# Patient Record
Sex: Female | Born: 1935 | Race: White | Hispanic: No | Smoking: Never smoker
Health system: Southern US, Community
[De-identification: ages and names within clinical notes are randomized; demographics above are authoritative.]

## PROBLEM LIST (undated history)

## (undated) DIAGNOSIS — F0390 Unspecified dementia without behavioral disturbance: Secondary | ICD-10-CM

## (undated) DIAGNOSIS — I10 Essential (primary) hypertension: Secondary | ICD-10-CM

---

## 2015-04-25 ENCOUNTER — Inpatient Hospital Stay (HOSPITAL_COMMUNITY)
Admission: EM | Admit: 2015-04-25 | Discharge: 2015-04-28 | DRG: 871 | Disposition: A | Discharge status: Skilled Nursing Facility | Payer: Medicare Other | Attending: Internal Medicine | Admitting: Internal Medicine

## 2015-04-25 ENCOUNTER — Emergency Department (HOSPITAL_COMMUNITY): Payer: Medicare Other

## 2015-04-25 ENCOUNTER — Encounter (HOSPITAL_COMMUNITY): Payer: Self-pay | Admitting: Emergency Medicine

## 2015-04-25 DIAGNOSIS — A419 Sepsis, unspecified organism: Secondary | ICD-10-CM | POA: Diagnosis present

## 2015-04-25 DIAGNOSIS — I10 Essential (primary) hypertension: Secondary | ICD-10-CM | POA: Diagnosis present

## 2015-04-25 DIAGNOSIS — R531 Weakness: Secondary | ICD-10-CM | POA: Diagnosis present

## 2015-04-25 DIAGNOSIS — T17908A Unspecified foreign body in respiratory tract, part unspecified causing other injury, initial encounter: Secondary | ICD-10-CM

## 2015-04-25 DIAGNOSIS — Z681 Body mass index (BMI) 19 or less, adult: Secondary | ICD-10-CM

## 2015-04-25 DIAGNOSIS — F0390 Unspecified dementia without behavioral disturbance: Secondary | ICD-10-CM | POA: Diagnosis not present

## 2015-04-25 DIAGNOSIS — N390 Urinary tract infection, site not specified: Secondary | ICD-10-CM | POA: Diagnosis present

## 2015-04-25 DIAGNOSIS — H9190 Unspecified hearing loss, unspecified ear: Secondary | ICD-10-CM | POA: Diagnosis present

## 2015-04-25 DIAGNOSIS — Z66 Do not resuscitate: Secondary | ICD-10-CM | POA: Diagnosis present

## 2015-04-25 DIAGNOSIS — J189 Pneumonia, unspecified organism: Secondary | ICD-10-CM | POA: Diagnosis present

## 2015-04-25 DIAGNOSIS — B9620 Unspecified Escherichia coli [E. coli] as the cause of diseases classified elsewhere: Secondary | ICD-10-CM | POA: Diagnosis present

## 2015-04-25 DIAGNOSIS — E43 Unspecified severe protein-calorie malnutrition: Secondary | ICD-10-CM | POA: Diagnosis present

## 2015-04-25 DIAGNOSIS — E440 Moderate protein-calorie malnutrition: Secondary | ICD-10-CM | POA: Insufficient documentation

## 2015-04-25 DIAGNOSIS — L8990 Pressure ulcer of unspecified site, unspecified stage: Secondary | ICD-10-CM | POA: Insufficient documentation

## 2015-04-25 HISTORY — DX: Essential (primary) hypertension: I10

## 2015-04-25 HISTORY — DX: Unspecified dementia, unspecified severity, without behavioral disturbance, psychotic disturbance, mood disturbance, and anxiety: F03.90

## 2015-04-25 LAB — I-STAT TROPONIN, ED: TROPONIN I, POC: 0.01 ng/mL (ref 0.00–0.08)

## 2015-04-25 LAB — COMPREHENSIVE METABOLIC PANEL
ALT: 9 U/L — ABNORMAL LOW (ref 14–54)
AST: 19 U/L (ref 15–41)
Albumin: 3.2 g/dL — ABNORMAL LOW (ref 3.5–5.0)
Alkaline Phosphatase: 86 U/L (ref 38–126)
Anion gap: 8 (ref 5–15)
BUN: 18 mg/dL (ref 6–20)
CHLORIDE: 104 mmol/L (ref 101–111)
CO2: 28 mmol/L (ref 22–32)
Calcium: 9.3 mg/dL (ref 8.9–10.3)
Creatinine, Ser: 0.73 mg/dL (ref 0.44–1.00)
Glucose, Bld: 121 mg/dL — ABNORMAL HIGH (ref 65–99)
POTASSIUM: 3.7 mmol/L (ref 3.5–5.1)
SODIUM: 140 mmol/L (ref 135–145)
Total Bilirubin: 0.8 mg/dL (ref 0.3–1.2)
Total Protein: 7.8 g/dL (ref 6.5–8.1)

## 2015-04-25 LAB — I-STAT CG4 LACTIC ACID, ED: Lactic Acid, Venous: 1.22 mmol/L (ref 0.5–2.0)

## 2015-04-25 LAB — URINALYSIS, ROUTINE W REFLEX MICROSCOPIC
BILIRUBIN URINE: NEGATIVE
Glucose, UA: NEGATIVE mg/dL
KETONES UR: NEGATIVE mg/dL
NITRITE: POSITIVE — AB
PROTEIN: NEGATIVE mg/dL
Specific Gravity, Urine: 1.022 (ref 1.005–1.030)
UROBILINOGEN UA: 0.2 mg/dL (ref 0.0–1.0)
pH: 6 (ref 5.0–8.0)

## 2015-04-25 LAB — CBC
HCT: 38.8 % (ref 36.0–46.0)
Hemoglobin: 12.6 g/dL (ref 12.0–15.0)
MCH: 28.8 pg (ref 26.0–34.0)
MCHC: 32.5 g/dL (ref 30.0–36.0)
MCV: 88.8 fL (ref 78.0–100.0)
PLATELETS: 278 10*3/uL (ref 150–400)
RBC: 4.37 MIL/uL (ref 3.87–5.11)
RDW: 13.1 % (ref 11.5–15.5)
WBC: 11.7 10*3/uL — AB (ref 4.0–10.5)

## 2015-04-25 LAB — PROTIME-INR
INR: 1.25 (ref 0.00–1.49)
PROTHROMBIN TIME: 15.9 s — AB (ref 11.6–15.2)

## 2015-04-25 LAB — URINE MICROSCOPIC-ADD ON

## 2015-04-25 LAB — LACTIC ACID, PLASMA: LACTIC ACID, VENOUS: 1.2 mmol/L (ref 0.5–2.0)

## 2015-04-25 LAB — APTT: aPTT: 35 seconds (ref 24–37)

## 2015-04-25 LAB — PROCALCITONIN

## 2015-04-25 MED ORDER — OXYCODONE HCL 5 MG PO TABS: 5.0000 mg | ORAL_TABLET | ORAL | Status: DC | PRN

## 2015-04-25 MED ORDER — SODIUM CHLORIDE 0.9 % IV BOLUS (SEPSIS)
1000.0000 mL | Freq: Once | INTRAVENOUS | Status: AC
  Administered 2015-04-25: 1000 mL via INTRAVENOUS

## 2015-04-25 MED ORDER — DEXTROSE 5 % IV SOLN: 500.0000 mg | Freq: Once | INTRAVENOUS | Status: DC

## 2015-04-25 MED ORDER — ONDANSETRON HCL 4 MG PO TABS: 4.0000 mg | ORAL_TABLET | Freq: Four times a day (QID) | ORAL | Status: DC | PRN

## 2015-04-25 MED ORDER — PANTOPRAZOLE SODIUM 40 MG IV SOLR
40.0000 mg | Freq: Every day | INTRAVENOUS | Status: DC
  Administered 2015-04-25 – 2015-04-27 (×3): 40 mg via INTRAVENOUS
  Filled 2015-04-25 (×3): qty 40

## 2015-04-25 MED ORDER — CHLORHEXIDINE GLUCONATE 0.12 % MT SOLN
15.0000 mL | Freq: Two times a day (BID) | OROMUCOSAL | Status: DC
  Administered 2015-04-26 – 2015-04-28 (×6): 15 mL via OROMUCOSAL
  Filled 2015-04-25 (×6): qty 15

## 2015-04-25 MED ORDER — SODIUM CHLORIDE 0.9 % IV BOLUS (SEPSIS): 1000.0000 mL | Freq: Once | INTRAVENOUS | Status: DC

## 2015-04-25 MED ORDER — SODIUM CHLORIDE 0.9 % IV SOLN: INTRAVENOUS | Status: DC

## 2015-04-25 MED ORDER — SODIUM CHLORIDE 0.9 % IV BOLUS (SEPSIS): 1000.0000 mL | INTRAVENOUS | Status: AC

## 2015-04-25 MED ORDER — HYDROMORPHONE HCL 1 MG/ML IJ SOLN: 0.5000 mg | INTRAMUSCULAR | Status: DC | PRN

## 2015-04-25 MED ORDER — ALUM & MAG HYDROXIDE-SIMETH 200-200-20 MG/5ML PO SUSP: 30.0000 mL | Freq: Four times a day (QID) | ORAL | Status: DC | PRN

## 2015-04-25 MED ORDER — ONDANSETRON HCL 4 MG/2ML IJ SOLN: 4.0000 mg | Freq: Four times a day (QID) | INTRAMUSCULAR | Status: DC | PRN

## 2015-04-25 MED ORDER — DEXTROSE 5 % IV SOLN
1.0000 g | INTRAVENOUS | Status: DC
  Administered 2015-04-26 – 2015-04-27 (×2): 1 g via INTRAVENOUS
  Filled 2015-04-25 (×3): qty 10

## 2015-04-25 MED ORDER — SODIUM CHLORIDE 0.9 % IV SOLN
INTRAVENOUS | Status: DC
  Administered 2015-04-25 – 2015-04-26 (×2): via INTRAVENOUS

## 2015-04-25 MED ORDER — AZITHROMYCIN 500 MG IV SOLR: 500.0000 mg | INTRAVENOUS | Status: DC

## 2015-04-25 MED ORDER — ACETAMINOPHEN 325 MG PO TABS
650.0000 mg | ORAL_TABLET | Freq: Four times a day (QID) | ORAL | Status: DC | PRN
Start: 2015-04-25 — End: 2015-04-28

## 2015-04-25 MED ORDER — ACETAMINOPHEN 650 MG RE SUPP
650.0000 mg | Freq: Four times a day (QID) | RECTAL | Status: DC | PRN
Start: 2015-04-25 — End: 2015-04-28

## 2015-04-25 MED ORDER — ENOXAPARIN SODIUM 30 MG/0.3ML ~~LOC~~ SOLN
30.0000 mg | SUBCUTANEOUS | Status: DC
  Administered 2015-04-26: 30 mg via SUBCUTANEOUS
  Filled 2015-04-25 (×2): qty 0.3

## 2015-04-25 MED ORDER — CETYLPYRIDINIUM CHLORIDE 0.05 % MT LIQD
7.0000 mL | Freq: Two times a day (BID) | OROMUCOSAL | Status: DC
  Administered 2015-04-26 – 2015-04-27 (×4): 7 mL via OROMUCOSAL

## 2015-04-25 MED ORDER — AZITHROMYCIN 500 MG IV SOLR
500.0000 mg | INTRAVENOUS | Status: DC
  Administered 2015-04-26 – 2015-04-27 (×2): 500 mg via INTRAVENOUS
  Filled 2015-04-25 (×4): qty 500

## 2015-04-25 MED ORDER — DEXTROSE 5 % IV SOLN
1.0000 g | Freq: Once | INTRAVENOUS | Status: AC
  Administered 2015-04-25: 1 g via INTRAVENOUS
  Filled 2015-04-25: qty 10

## 2015-04-25 MED ORDER — DEXTROSE 5 % IV SOLN: 1.0000 g | INTRAVENOUS | Status: DC

## 2015-04-25 MED ORDER — THIAMINE HCL 100 MG/ML IJ SOLN
100.0000 mg | Freq: Once | INTRAMUSCULAR | Status: AC
Start: 2015-04-25 — End: 2015-04-25
  Administered 2015-04-25: 100 mg via INTRAVENOUS
  Filled 2015-04-25: qty 2

## 2015-04-25 MED ORDER — SODIUM CHLORIDE 0.9 % IV BOLUS (SEPSIS): 500.0000 mL | INTRAVENOUS | Status: AC

## 2015-04-25 MED ORDER — CEFTRIAXONE SODIUM 1 G IJ SOLR: 1.0000 g | Freq: Once | INTRAMUSCULAR | Status: DC

## 2015-04-25 NOTE — ED Notes (Signed)
Bed: ON62 Expected date:  Expected time:  Means of arrival:  Comments: Ems- 81 yo UTI

## 2015-04-25 NOTE — H&P (Addendum)
[facility] Admission History and Physical       Belinda Richardson WUJ:[id number] DOB: 12/22/1920 DOA: 04/25/2015  Referring physician: EDP PCP: No PCP Per Patient  Specialists:   Chief Complaint: Weakness  HPI: Belinda Richardson is a 81 y.o. female with a histotry of HTN and Dementia who presents to the ED with poor intake of foods and liquids and increased weakness for the past 2 days.   She lives with her daughter and moved from [place] 1 year ago and has not established with a PCP yet.   She is unable to give her history , and her daughter gives the history.    She is confused at baseline, and her daughter reports that she is at her baseline at this time.        Review of Systems: Unable to Obtain from the Patient  Past Medical History  Diagnosis Date  . Hypertension   . Dementia      History reviewed. No pertinent past surgical history.    Prior to Admission medications   Not on File     No Known Allergies    Social History:  reports that she does not drink alcohol. Her tobacco and drug histories are not on file.     History reviewed. No pertinent family history.     Physical Exam:  GEN:  Pleasant  Cachectic Elderly 81 y.o. Caucasian female examined and in no acute distress; cooperative with exam Filed Vitals:   04/25/15 1420 04/25/15 1622 04/25/15 1630 04/25/15 1700  BP: 179/94 166/92  145/88  Pulse: 79 123 84 113  Temp: 98.6 F (37 C)     TempSrc: Oral     Resp: 21 23 34 22  SpO2: 97% 97% 94% 95%   Blood pressure 145/88, pulse 113, temperature 98.6 F (37 C), temperature source Oral, resp. rate 22, SpO2 95 %. PSYCH: She is alert and oriented x 1; does not appear anxious does not appear depressed; affect is normal HEENT: Normocephalic and Atraumatic, Mucous membranes pink; PERRLA; EOM intact; Fundi:  Benign;  No scleral icterus, Nares: Patent, Oropharynx: Clear,   Neck:  FROM, No Cervical Lymphadenopathy nor Thyromegaly or Carotid Bruit; No  JVD; Breasts:: Not examined CHEST WALL: No tenderness CHEST: Normal respiration, clear to auscultation bilaterally HEART: Regular rate and rhythm; no murmurs rubs or gallops BACK: No kyphosis or scoliosis; No CVA tenderness ABDOMEN: Positive Bowel Sounds, Scaphoid, Soft Non-Tender, No Rebound or Guarding; No Masses, No Organomegaly. Rectal Exam: Not done EXTREMITIES: No Cyanosis, Clubbing, or Edema; No Ulcerations. Genitalia: not examined PULSES: 2+ and symmetric SKIN: Normal hydration no rash or ulceration CNS:  Alert and Oriented x 4, No Focal Deficits Vascular: pulses palpable throughout    Labs on Admission:  Basic Metabolic Panel:  Recent Labs Lab 04/25/15 1656  NA 140  K 3.7  CL 104  CO2 28  GLUCOSE 121*  BUN 18  CREATININE 0.73  CALCIUM 9.3   Liver Function Tests:  Recent Labs Lab 04/25/15 1656  AST 19  ALT 9*  ALKPHOS 86  BILITOT 0.8  PROT 7.8  ALBUMIN 3.2*   No results for input(s): LIPASE, AMYLASE in the last 168 hours. No results for input(s): AMMONIA in the last 168 hours. CBC:  Recent Labs Lab 04/25/15 1656  WBC 11.7*  HGB 12.6  HCT 38.8  MCV 88.8  PLT 278   Cardiac Enzymes: No results for input(s): CKTOTAL, CKMB, CKMBINDEX, TROPONINI in the last 168 hours.  BNP (last 3 results)  No results for input(s): BNP in the last 8760 hours.  ProBNP (last 3 results) No results for input(s): PROBNP in the last 8760 hours.  CBG: No results for input(s): GLUCAP in the last 168 hours.  Radiological Exams on Admission: Dg Chest 1 View  04/25/2015   CLINICAL DATA:  Dimension, decreased appetite.  EXAM: CHEST  1 VIEW  COMPARISON:  None.  FINDINGS: Normal cardiac silhouette. There is hazy airspace opacity in the LEFT upper lobe superior to the aortic large. There is a mild interstitial pattern in the lower lobes. No pneumothorax.  IMPRESSION: 1. Concern for LEFT upper lobe pneumonia or aspiration pneumonitis. 2. Underlying and mild interstitial edema /  venous congestion.   Electronically Signed   By: Genevive Bi M.D.   On: 04/25/2015 17:42   Ct Head Wo Contrast  04/25/2015   CLINICAL DATA:  Dementia. Marisue Ivan at home with daughter. Mental status change from baseline according to the daughter.  EXAM: CT HEAD WITHOUT CONTRAST  TECHNIQUE: Contiguous axial images were obtained from the base of the skull through the vertex without intravenous contrast.  COMPARISON:  None.  FINDINGS: There is no evidence of mass effect, midline shift, or extra-axial fluid collections. There is no evidence of a space-occupying lesion or intracranial hemorrhage. There is no evidence of a cortical-based area of acute infarction. There is an age-indeterminate lacunar infarct in thalamus bilaterally. There is an age-indeterminate right basal ganglia lacunar infarct. There is generalized cerebral atrophy. There is periventricular white matter low attenuation likely secondary to microangiopathy.  The ventricles and sulci are appropriate for the patient's age. The basal cisterns are patent.  Visualized portions of the orbits are unremarkable. There is cerumen in the right external auditory canal. The visualized portions of the paranasal sinuses and mastoid air cells are unremarkable. Cerebrovascular atherosclerotic calcifications are noted.  The osseous structures are unremarkable.  IMPRESSION: 1. Age-indeterminate lacunar infarct in thalamus bilaterally. 2. Age-indeterminate right basal ganglia lacunar infarct. 3. Chronic microvascular disease and cerebral atrophy.   Electronically Signed   By: Elige Ko   On: 04/25/2015 17:36     EKG: Independently reviewed. Norma Sinus Rhythm rate =75   Assessment/Plan:   81 y.o. female with  Active Problems:   1.    Sepsis/UTI (lower urinary tract infection)/CAP (community acquired pneumonia)   Sepsis workup Initiated   IV Rocephin and Azithromycin   Swallow Evaluation, NPO for now     2.    Weakness- due to #1 and #2      3.     Malnutrition   Swallow Eval for ? Aspiration   Nutrition Evaluation       3.    Hypertension   Monitor BPs   PRN IV Hydralazine     4.    Dementia   Without Behavioral Disturbance     5.    DVT Prophylaxis   Lovenox         Code Status:    DO NOT RESUSCITATE (DNR)      Family Communication:    No Family Present    Disposition Plan:    Inpatient Status        Time spent:  33 Minutes      Ron Parker [facility] Pager [phone]   If 7AM -7PM Please Contact the Day Rounding Team MD for [facility]  If 7PM-7AM, Please Contact Night-Floor Coverage  [email or web address] Password [facility] 04/25/2015, 6:46 PM     ADDENDUM:   Patient was seen  and examined on 04/25/2015

## 2015-04-25 NOTE — ED Notes (Signed)
Pt comes in today with EMS from home. Pt has a hx of Dementia. Pt lives at home with daughter. Daughter reports in the past 2 days she has been less active and a decreased appetite. Pt mental status is baseline per daughter. Daughter states that she has had urinary frequency with dark in color and odorous.

## 2015-04-25 NOTE — ED Notes (Signed)
Spoke with Erica, Charge RN. 

## 2015-04-25 NOTE — ED Provider Notes (Signed)
CSN: [id number]     Arrival date & time 04/25/15  1407 History   First MD Initiated Contact with Patient 04/25/15 1516     Chief Complaint  Patient presents with  . Urinary Tract Infection     (Consider location/radiation/quality/duration/timing/severity/associated sxs/prior Treatment) HPI Level V caveat dementia history is obtained from patient's daughter Dorreen Hendley via telephone at [phone]. Patient has been progressively more weak over the past 2 days with diminished appetite. She's been too weak to stand up from a chair. She normally ambulates with minimal assistance. Patient denies any specific complaint. No treatment prior to coming here. No known fever. No cough. No other associated symptoms. Past Medical History  Diagnosis Date  . Hypertension   . Dementia    History reviewed. No pertinent past surgical history. History reviewed. No pertinent family history. Social History  Substance Use Topics  . Smoking status: Unknown If Ever Smoked  . Smokeless tobacco: None  . Alcohol Use: No   OB History    No data available     Review of Systems  Unable to perform ROS: Dementia  Constitutional: Positive for activity change and appetite change.  Neurological: Positive for weakness.      Allergies  Review of patient's allergies indicates no known allergies.  Home Medications   Prior to Admission medications   Not on File   BP 166/92 mmHg  Pulse 84  Temp(Src) 98.6 F (37 C) (Oral)  Resp 34  SpO2 84% Physical Exam  Constitutional: No distress.  Chronically ill-appearing  HENT:  Head: Normocephalic and atraumatic.  Mucous membranes dry  Eyes: Conjunctivae are normal. Pupils are equal, round, and reactive to light.  Neck: Neck supple. No tracheal deviation present. No thyromegaly present.  Cardiovascular: Normal rate.   No murmur heard. Pulmonary/Chest: Effort normal and breath sounds normal.  Abdominal: Soft. Bowel sounds are normal. She exhibits no  distension. There is no tenderness.  Genitourinary:  Rectum normal tone and soft brown stool no gross blood  Musculoskeletal: Normal range of motion. She exhibits no edema or tenderness.  Neurological: She is alert. Coordination normal.  Follow simple commands moves all extremities  Skin: Skin is warm and dry. No rash noted.  Psychiatric: She has a normal mood and affect.  Nursing note and vitals reviewed.   ED Course  Procedures (including critical care time) Labs Review Labs Reviewed  CBC  COMPREHENSIVE METABOLIC PANEL  URINALYSIS, ROUTINE W REFLEX MICROSCOPIC (NOT AT [facility])  Rosezena Sensor, ED  POC OCCULT BLOOD, ED    Imaging Review No results found. I have personally reviewed and evaluated these images and lab results as part of my medical decision-making.   EKG Interpretation None     chest x-ray viewed by me Results for orders placed or performed during the hospital encounter of 04/25/15  CBC  Result Value Ref Range   WBC 11.7 (H) 4.0 - 10.5 K/uL   RBC 4.37 3.87 - 5.11 MIL/uL   Hemoglobin 12.6 12.0 - 15.0 g/dL   HCT 40.9 81.1 - 91.4 %   MCV 88.8 78.0 - 100.0 fL   MCH 28.8 26.0 - 34.0 pg   MCHC 32.5 30.0 - 36.0 g/dL   RDW 78.2 95.6 - 21.3 %   Platelets 278 150 - 400 K/uL  Comprehensive metabolic panel  Result Value Ref Range   Sodium 140 135 - 145 mmol/L   Potassium 3.7 3.5 - 5.1 mmol/L   Chloride 104 101 - 111 mmol/L  CO2 28 22 - 32 mmol/L   Glucose, Bld 121 (H) 65 - 99 mg/dL   BUN 18 6 - 20 mg/dL   Creatinine, Ser 7.82 0.44 - 1.00 mg/dL   Calcium 9.3 8.9 - 95.6 mg/dL   Total Protein 7.8 6.5 - 8.1 g/dL   Albumin 3.2 (L) 3.5 - 5.0 g/dL   AST 19 15 - 41 U/L   ALT 9 (L) 14 - 54 U/L   Alkaline Phosphatase 86 38 - 126 U/L   Total Bilirubin 0.8 0.3 - 1.2 mg/dL   GFR calc non Af Amer >60 >60 mL/min   GFR calc Af Amer >60 >60 mL/min   Anion gap 8 5 - 15  Urinalysis, Routine w reflex microscopic (not at [facility])  Result Value Ref Range   Color, Urine YELLOW  YELLOW   APPearance CLOUDY (A) CLEAR   Specific Gravity, Urine 1.022 1.005 - 1.030   pH 6.0 5.0 - 8.0   Glucose, UA NEGATIVE NEGATIVE mg/dL   Hgb urine dipstick SMALL (A) NEGATIVE   Bilirubin Urine NEGATIVE NEGATIVE   Ketones, ur NEGATIVE NEGATIVE mg/dL   Protein, ur NEGATIVE NEGATIVE mg/dL   Urobilinogen, UA 0.2 0.0 - 1.0 mg/dL   Nitrite POSITIVE (A) NEGATIVE   Leukocytes, UA SMALL (A) NEGATIVE  Urine microscopic-add on  Result Value Ref Range   WBC, UA 7-10 <3 WBC/hpf   Bacteria, UA MANY (A) RARE  I-Stat Troponin, ED (not at [facility])  Result Value Ref Range   Troponin i, poc 0.01 0.00 - 0.08 ng/mL   Comment 3           Dg Chest 1 View  04/25/2015   CLINICAL DATA:  Dimension, decreased appetite.  EXAM: CHEST  1 VIEW  COMPARISON:  None.  FINDINGS: Normal cardiac silhouette. There is hazy airspace opacity in the LEFT upper lobe superior to the aortic large. There is a mild interstitial pattern in the lower lobes. No pneumothorax.  IMPRESSION: 1. Concern for LEFT upper lobe pneumonia or aspiration pneumonitis. 2. Underlying and mild interstitial edema / venous congestion.   Electronically Signed   By: Genevive Bi M.D.   On: 04/25/2015 17:42   Ct Head Wo Contrast  04/25/2015   CLINICAL DATA:  Dementia. Marisue Ivan at home with daughter. Mental status change from baseline according to the daughter.  EXAM: CT HEAD WITHOUT CONTRAST  TECHNIQUE: Contiguous axial images were obtained from the base of the skull through the vertex without intravenous contrast.  COMPARISON:  None.  FINDINGS: There is no evidence of mass effect, midline shift, or extra-axial fluid collections. There is no evidence of a space-occupying lesion or intracranial hemorrhage. There is no evidence of a cortical-based area of acute infarction. There is an age-indeterminate lacunar infarct in thalamus bilaterally. There is an age-indeterminate right basal ganglia lacunar infarct. There is generalized cerebral atrophy. There is  periventricular white matter low attenuation likely secondary to microangiopathy.  The ventricles and sulci are appropriate for the patient's age. The basal cisterns are patent.  Visualized portions of the orbits are unremarkable. There is cerumen in the right external auditory canal. The visualized portions of the paranasal sinuses and mastoid air cells are unremarkable. Cerebrovascular atherosclerotic calcifications are noted.  The osseous structures are unremarkable.  IMPRESSION: 1. Age-indeterminate lacunar infarct in thalamus bilaterally. 2. Age-indeterminate right basal ganglia lacunar infarct. 3. Chronic microvascular disease and cerebral atrophy.   Electronically Signed   By: Elige Ko   On: 04/25/2015 17:36  MDM Code sepsis called based on Sirs criteria of tachycardi, tachypnea. Likely source respiratory  daughter reports the patient is DO NOT RESUSCITATE CODE STATUS  Final diagnoses:  UTI (lower urinary tract infection)  Spoke with Dr. Lovell Sheehan plan admit medical surgical floor  Plan intravenous fluids, intravenous antibiotics. Dx#1 community acquired pneumonia #2 altered mental status #3 urinary tract infection #4 sepsis    Doug Sou, MD 04/25/15 1845

## 2015-04-25 NOTE — Progress Notes (Signed)
EDCM went to bedside to speak to patient, however, patient very confused, with history of dementia.  EDCM called patient's daughter [name] at [phone].  Patient lives with daughter.  [name] reports patient has [employer] and [employer] cross and blue shield insurance.  EDCM informed registration of patient's insurance.  [name] also reports patient does not have a pcp as they have just moved here from [place].  [name] reports the patient has "Been like this" for five years.  Patient's dementia has gotten worse per [name].  Patient doesn't speak ,"Once ina while she will say a word or two,"  And hasn't been eating per [name].  [name] reports she has placed a call to [facility] and spoke to NP Marletta Lor and has set up an appointment to come evaluate the patient but the patient has come to the ED.  [name] reports she has called Marletta Lor to make her aware the patient was in the hospital.  [name] reports patient has 2 walkers, bedside commode and a shower chair at home.  [name] is interested in obtaining a hospital bed and a wheelchair for patient.  [name] went on to say that she would very much like the patient to go to a nursing facility at discharge as she is having a very difficult time with the patient at home. [name] reports, "she can't even stand."  EDCM explained home health services to patient's daughter.  [name] reports, "If she is unable to get up to go to the bathroom, I can't take care of her."  [name] reports the patient requires total assist with all ADL's and meals.  [name] reports, "she doesn't even know who I am anymore."  EDCM offered support to patient's daughter.  Patient's daughter thankful for services.  No further EDCM needs at this time.

## 2015-04-25 NOTE — ED Notes (Signed)
Left antecubital vein blown from where previous nurse tried to start IV. Coban pressure dressing applied and ice pack applied.

## 2015-04-25 NOTE — Progress Notes (Signed)
ANTIBIOTIC CONSULT NOTE - INITIAL  Pharmacy Consult for Ceftriaxone and Azithromycin Indication: CAP, UTI  No Known Allergies  Patient Measurements:    Vital Signs: Temp: 98.6 F (37 C) (08/24 1420) Temp Source: Oral (08/24 1420) BP: 145/88 mmHg (08/24 1700) Pulse Rate: 113 (08/24 1700) Intake/Output from previous day:   Intake/Output from this shift: Total I/O In: -  Out: 30 [Urine:30]  Labs:  Recent Labs  04/25/15 1656  WBC 11.7*  HGB 12.6  PLT 278  CREATININE 0.73   CrCl cannot be calculated (Unknown ideal weight.). No results for input(s): VANCOTROUGH, VANCOPEAK, VANCORANDOM, GENTTROUGH, GENTPEAK, GENTRANDOM, TOBRATROUGH, TOBRAPEAK, TOBRARND, AMIKACINPEAK, AMIKACINTROU, AMIKACIN in the last 72 hours.   Microbiology: No results found for this or any previous visit (from the past 720 hour(s)).  Medical History: Past Medical History  Diagnosis Date  . Hypertension   . Dementia     Medications:  Scheduled:   Infusions:  . azithromycin    . cefTRIAXone (ROCEPHIN)  IV    . sodium chloride    . sodium chloride     Followed by  . sodium chloride     PRN:   Assessment: 81 yo female with dementia presents from home with progressive weakness, decreased appetite, urinary frequency and dark, malodorous urine. CXR concerning for LUL pneumonia or aspiration pneumonitis. Code sepsis called and Pharmacy consulted to dose antibiotics.  8/24 >> ceftriaxone >> 8/24 >> azithromycin >>  Tmax: afebrile WBC: 11.7k Renal: SCr 0.73, CrCl ~50 ml/min/1.41m2 (normalized)  8/24 blood cx: sent 8/24 urine cx: sent (UA pyuria)  Goal of Therapy:  Eradication of infection  Plan:   Ceftriaxone 1g IV q24h x 7 days  Azithromycin  IV q24h x 7 days  No further dose adjustments needed, pharmacy will sign off  Transition to oral therapy as appropriate  Belinda Richardson, PharmD, BCPS Pager: [phone] 04/25/2015,6:24 PM

## 2015-04-26 ENCOUNTER — Inpatient Hospital Stay (HOSPITAL_COMMUNITY): Payer: Medicare Other

## 2015-04-26 ENCOUNTER — Encounter (HOSPITAL_COMMUNITY): Payer: Self-pay | Admitting: *Deleted

## 2015-04-26 DIAGNOSIS — E440 Moderate protein-calorie malnutrition: Secondary | ICD-10-CM | POA: Insufficient documentation

## 2015-04-26 DIAGNOSIS — L8990 Pressure ulcer of unspecified site, unspecified stage: Secondary | ICD-10-CM | POA: Insufficient documentation

## 2015-04-26 DIAGNOSIS — J189 Pneumonia, unspecified organism: Secondary | ICD-10-CM

## 2015-04-26 DIAGNOSIS — A419 Sepsis, unspecified organism: Principal | ICD-10-CM

## 2015-04-26 DIAGNOSIS — F0390 Unspecified dementia without behavioral disturbance: Secondary | ICD-10-CM

## 2015-04-26 DIAGNOSIS — N390 Urinary tract infection, site not specified: Secondary | ICD-10-CM

## 2015-04-26 LAB — CBC
HCT: 32.5 % — ABNORMAL LOW (ref 36.0–46.0)
Hemoglobin: 10.5 g/dL — ABNORMAL LOW (ref 12.0–15.0)
MCH: 28.9 pg (ref 26.0–34.0)
MCHC: 32.3 g/dL (ref 30.0–36.0)
MCV: 89.5 fL (ref 78.0–100.0)
PLATELETS: 219 10*3/uL (ref 150–400)
RBC: 3.63 MIL/uL — AB (ref 3.87–5.11)
RDW: 13.3 % (ref 11.5–15.5)
WBC: 8.4 10*3/uL (ref 4.0–10.5)

## 2015-04-26 LAB — BASIC METABOLIC PANEL
Anion gap: 7 (ref 5–15)
BUN: 16 mg/dL (ref 6–20)
CHLORIDE: 111 mmol/L (ref 101–111)
CO2: 23 mmol/L (ref 22–32)
CREATININE: 0.56 mg/dL (ref 0.44–1.00)
Calcium: 8.6 mg/dL — ABNORMAL LOW (ref 8.9–10.3)
GFR calc Af Amer: >60 mL/min (ref >60)
GFR calc non Af Amer: >60 mL/min (ref >60)
GLUCOSE: 91 mg/dL (ref 65–99)
Potassium: 3.9 mmol/L (ref 3.5–5.1)
Sodium: 141 mmol/L (ref 135–145)

## 2015-04-26 MED ORDER — ENOXAPARIN SODIUM 40 MG/0.4ML ~~LOC~~ SOLN
40.0000 mg | SUBCUTANEOUS | Status: DC
  Administered 2015-04-26 – 2015-04-27 (×2): 40 mg via SUBCUTANEOUS
  Filled 2015-04-26 (×2): qty 0.4

## 2015-04-26 NOTE — Care Management Note (Signed)
Case Management Note  Patient Details  Name: Asmaa Tirpak MRN: [id number] Date of Birth: 1920/11/03  Subjective/Objective: 81 y/o f admitted w/PNA, UTI. From home w/dtr.PT cons-await recommendations.                 Action/Plan:Monitor progress for d/c plans.   Expected Discharge Date:                  Expected Discharge Plan:  Skilled Nursing Facility  In-House Referral:     Discharge planning Services  CM Consult  Post Acute Care Choice:    Choice offered to:     DME Arranged:    DME Agency:     HH Arranged:    HH Agency:     Status of Service:  In process, will continue to follow  Medicare Important Message Given:    Date Medicare IM Given:    Medicare IM give by:    Date Additional Medicare IM Given:    Additional Medicare Important Message give by:     If discussed at Long Length of Stay Meetings, dates discussed:    Additional Comments:  Lanier Clam, RN 04/26/2015, 3:03 PM

## 2015-04-26 NOTE — Progress Notes (Signed)
Initial Nutrition Assessment  DOCUMENTATION CODES:   Underweight, Severe malnutrition in context of chronic illness  INTERVENTION:  - Will order supplements as appropriate with diet advancement -RD will continue to monitor for needs  NUTRITION DIAGNOSIS:   Inadequate oral intake related to inability to eat as evidenced by NPO status.  GOAL:   Patient will meet greater than or equal to 90% of their needs  MONITOR:   Diet advancement, Weight trends, Labs, I & O's  REASON FOR ASSESSMENT:   Malnutrition Screening Tool  ASSESSMENT:   81 y.o. female with a histotry of HTN and Dementia who presents to the ED with poor intake of foods and liquids and increased weakness for the past 2 days. She lives with her daughter and moved from [place] 1 year ago and has not established with a PCP yet. She is unable to give her history , and her daughter gives the history. She is confused at baseline, and her daughter reports that she is at her baseline at this time.   Pt seen for MST. BMI indicates underweight status. Pt has been NPO since admission and MBS scheduled for later today which was confirmed per discussion with RN.  Pt with hx of dementia and sleeping at time of visit. No family/visitors present. Performed physical assessment which showed moderate and severe muscle and moderate and severe fat wasting; called pt's name throughout physical assessment so she would not be startled in case she awoke.  No prior weight hx available in the chart. Not able to meet needs. Medications reviewed. Labs reviewed; Ca: 8.6 mg/dL.      Diet Order:    NPO  Skin:  Wound (see comment) (Stage 1 sacral pressure ulcer)  Last BM:  PTA  Height:   Ht Readings from Last 1 Encounters:  04/25/15  (1.778 m)    Weight:   Wt Readings from Last 1 Encounters:  04/25/15 115 lb 1.3 oz (52.2 kg)    Ideal Body Weight:  68.18 kg (kg)  BMI:  Body mass index is 16.51 kg/(m^2).  Estimated  Nutritional Needs:   Kcal:  1050-1250  Protein:  50-60 grarms  Fluid:  2.2 L/day  EDUCATION NEEDS:   No education needs identified at this time     Trenton Gammon, RD, LDN Inpatient Clinical Dietitian Pager # [phone] After hours/weekend pager # [phone]

## 2015-04-26 NOTE — Progress Notes (Signed)
SLP order received for MBS, spoke to RN who reports pt has been asleep since admit. SLP to follow up later today to determine if pt adequately alert for MBS/po intake.  Belinda Burnet, MS [facility] SLP [phone]

## 2015-04-26 NOTE — Progress Notes (Signed)
Full report in image section  MBS Assessment / Plan / Recommendation [facility] IP CLINICAL IMPRESSIONS 04/26/2015  Therapy Diagnosis Mild oral phase dysphagia;Mild pharyngeal phase dysphagia  Clinical Impression Mild oropharyngeal dysphagia with decreased oral bolus cohesion due to discoordination resulting in minimal residuals after swallow.  Mastication of cracker was laborious for patient evidenced by delay and vertical mastication pattern.  Pharyngeal swallow was mildly delayed to valleuclar space - spilling over epiglottis due to premature spillage and sensory deficits.  TRACE SILENT aspiration of thin x1 of 5 boluses noted that did not fully clear with cued cough.  Use of straw prevented aspiration  - likely due to better oral control and decreased premature spillage.  Skilled intervention included determining effective compensation strategies.  Cued throat clear and dry swallows faciliate clearance.     Educated pt to findings using live video, however her hearing loss and cognitive impairment impact her receptive communication and recall.  Of note, suspect component of esophageal deficits with appearance of delayed clearance- radiologist not present to confirm.  Thin water appeared to aid clearance - suspect pt may be at more risk of aspiration with nectar thick consistencies due to suspected esophageal isues/ ? decreased clearance.    Recommend dys3/thin with strict precautions - using straws for liquids. SLP to follow up to educate family/pt to mitigation strategies for dysphagia management.  Thanks for this consult. Ruel Favors IP TREATMENT RECOMMENDATION 04/26/2015  Treatment Recommendations Therapy as outlined in treatment plan below     CHL IP DIET RECOMMENDATION 04/26/2015  SLP Diet Recommendations Dysphagia 3 (Mech soft);Thin  Liquid Administration via Straws  Medication Administration Whole meds with puree  Compensations Slow rate;Small sips/bites;Follow solids with liquid;Clear throat  intermittently  Postural Changes and/or Swallow Maneuvers Reflux precautions      Donavan Burnet, MS [facility] SLP [phone]

## 2015-04-26 NOTE — Progress Notes (Signed)
PROGRESS NOTE  Belinda Richardson ZOX:[id number] DOB: 12-13-1920 DOA: 04/25/2015 PCP: No PCP Per Patient  HPI: 81 y.o. female with a histotry of HTN and Dementia who presents to the ED with poor intake of foods and liquids and increased weakness 2 days prior to admission. She was found to have a UTI and CAP and was admitted for IV antibiotics.   Subjective / 24 H Interval events - alert this morning, oriented to person only - has no complaints  Assessment/Plan: Active Problems:   Sepsis   UTI (lower urinary tract infection)   CAP (community acquired pneumonia)   Weakness   Hypertension   Dementia   Community acquired pneumonia   Pressure ulcer   Malnutrition of moderate degree   Sepsis in the setting of UTI / CAP - continue Ceftriaxone and Azithromycin. - urine cultures, blood cultures pending  - sepsis physiology improving   UTI - UA positive for an infection, cultures pending. On Ceftriaxone  CAP - CXR with left upper lobe pneumonia - SLP evaluation pending to evaluate for aspiration - antibiotics as above  HTN - history of HTN, not on any medications, currently blood pressure is normal, monitor  Moderate malnutrition - nutrition consult  Dementia - PT evaluation pending - SLP evaluation pending   Diet:   Fluids: NS DVT Prophylaxis: Lovenox  Code Status: DNR Family Communication: no family bedside  Disposition Plan: remain inpatient  Consultants:  None   Procedures:  None    Antibiotics Ceftriaxone 8/24 >> Azithromycin 8/24 >>   Studies  Dg Chest 1 View  04/25/2015   CLINICAL DATA:  Dimension, decreased appetite.  EXAM: CHEST  1 VIEW  COMPARISON:  None.  FINDINGS: Normal cardiac silhouette. There is hazy airspace opacity in the LEFT upper lobe superior to the aortic large. There is a mild interstitial pattern in the lower lobes. No pneumothorax.  IMPRESSION: 1. Concern for LEFT upper lobe pneumonia or aspiration pneumonitis. 2. Underlying and  mild interstitial edema / venous congestion.   Electronically Signed   By: Genevive Bi M.D.   On: 04/25/2015 17:42   Ct Head Wo Contrast  04/25/2015   CLINICAL DATA:  Dementia. Marisue Ivan at home with daughter. Mental status change from baseline according to the daughter.  EXAM: CT HEAD WITHOUT CONTRAST  TECHNIQUE: Contiguous axial images were obtained from the base of the skull through the vertex without intravenous contrast.  COMPARISON:  None.  FINDINGS: There is no evidence of mass effect, midline shift, or extra-axial fluid collections. There is no evidence of a space-occupying lesion or intracranial hemorrhage. There is no evidence of a cortical-based area of acute infarction. There is an age-indeterminate lacunar infarct in thalamus bilaterally. There is an age-indeterminate right basal ganglia lacunar infarct. There is generalized cerebral atrophy. There is periventricular white matter low attenuation likely secondary to microangiopathy.  The ventricles and sulci are appropriate for the patient's age. The basal cisterns are patent.  Visualized portions of the orbits are unremarkable. There is cerumen in the right external auditory canal. The visualized portions of the paranasal sinuses and mastoid air cells are unremarkable. Cerebrovascular atherosclerotic calcifications are noted.  The osseous structures are unremarkable.  IMPRESSION: 1. Age-indeterminate lacunar infarct in thalamus bilaterally. 2. Age-indeterminate right basal ganglia lacunar infarct. 3. Chronic microvascular disease and cerebral atrophy.   Electronically Signed   By: Elige Ko   On: 04/25/2015 17:36    Objective  Filed Vitals:   04/25/15 2200 04/26/15 0300 04/26/15 8119 04/26/15 1200  BP:  116/71 126/68 120/65  Pulse:  90 88   Temp:  98.6 F (37 C) 97.6 F (36.4 C) 98.2 F (36.8 C)  TempSrc:  Axillary Oral Oral  Resp:  16 16 24   Height: 5\' 10"  (1.778 m)     Weight:      SpO2:  97% 98% 99%    Intake/Output Summary  (Last 24 hours) at 04/26/15 1240 Last data filed at 04/25/15 2300  Gross per 24 hour  Intake  36.25 ml  Output     30 ml  Net   6.25 ml   Filed Weights   04/25/15 2005  Weight: 52.2 kg (115 lb 1.3 oz)    Exam:  GENERAL: NAD, pleasantly confused  HEENT: head NCAT, no scleral icterus.   NECK: Supple.   LUNGS: Clear to auscultation. No wheezing or crackles  HEART: Regular rate and rhythm without murmur. 2+ pulses, no JVD, no peripheral edema  ABDOMEN: Soft, nontender, and nondistended. Positive bowel sounds.  EXTREMITIES: Without any cyanosis, clubbing  NEUROLOGIC: Alert and oriented x1. Strength 5/5 in all 4.  PSYCHIATRIC: with dementia  SKIN: No ulceration or induration present, healed sacral pressure ulcer  Data Reviewed: Basic Metabolic Panel:  Recent Labs Lab 04/25/15 1656 04/26/15 0535  NA 140 141  K 3.7 3.9  CL 104 111  CO2 28 23  GLUCOSE 121* 91  BUN 18 16  CREATININE 0.73 0.56  CALCIUM 9.3 8.6*   Liver Function Tests:  Recent Labs Lab 04/25/15 1656  AST 19  ALT 9*  ALKPHOS 86  BILITOT 0.8  PROT 7.8  ALBUMIN 3.2*   CBC:  Recent Labs Lab 04/25/15 1656 04/26/15 0535  WBC 11.7* 8.4  HGB 12.6 10.5*  HCT 38.8 32.5*  MCV 88.8 89.5  PLT 278 219    Recent Results (from the past 240 hour(s))  Urine culture     Status: None (Preliminary result)   Collection Time: 04/25/15  6:17 PM  Result Value Ref Range Status   Specimen Description URINE, RANDOM  Final   Special Requests Normal  Final   Culture   Final    TOO YOUNG TO READ Performed at [facility]    Report Status PENDING  Incomplete     Scheduled Meds: . antiseptic oral rinse  7 mL Mouth Rinse q12n4p  . azithromycin  500 mg Intravenous Once  . azithromycin  500 mg Intravenous Q24H  . cefTRIAXone (ROCEPHIN)  IV  1 g Intravenous Q24H  . chlorhexidine  15 mL Mouth Rinse BID  . enoxaparin (LOVENOX) injection  30 mg Subcutaneous Q24H  . pantoprazole (PROTONIX) IV  40 mg  Intravenous QHS   Continuous Infusions: . sodium chloride 50 mL/hr at 04/26/15 0908     Pamella Pert, MD [facility] Pager [phone]. If 7 PM - 7 AM, please contact night-coverage at [email or web address], password [facility] 04/26/2015, 12:40 PM  LOS: 1 day

## 2015-04-27 NOTE — Progress Notes (Signed)
Speech Language Pathology Treatment: Dysphagia  Patient Details Name: Belinda Richardson MRN: [id number] DOB: [birth year] Today's Date: 04/27/2015 Time: 1210-1223 SLP Time Calculation (min) (ACUTE ONLY): 13 min  Assessment / Plan / Recommendation Clinical Impression  F/u after yesterday's MBS. Pt alert, answering basic questions, states she is in [place].  Able to feed herself with safe rate and bolus sizes.  Consumed applesauce and water from straw with adequate oral attention and control, no overt s/s of aspiration (however, MBS showed trace silent aspiration - this is prevented with use of straw).  Mod assist overall for self-feeding.  Recommend continuing dysphagia 3 diet, thin liquids with precautions as described at [facility].  No further SLP f/u warranted.  Continue full supervision - allow pt to self-feed as able.     HPI Other Pertinent Information: 81 y.o. female with a histotry of HTN and Dementia who presents to the ED with poor intake of foods and liquids and increased weakness for the past 2 days. She lives with her daughter and moved from [place] 1 year ago and has not established with a PCP yet. She is confused at baseline, and her daughter reported to dietician that she is at her baseline at this time per RD note. Concern for left upper lobe pna or aspiration pneumonitis noted on CXR.  Swallow evaluation ordered (MBS).     Pertinent Vitals Pain Assessment: No/denies pain  SLP Plan  All goals met    Recommendations Diet recommendations: Dysphagia 3 (mechanical soft);Thin liquid Liquids provided via: Straw Medication Administration: Whole meds with puree Supervision: Patient able to self feed;Staff to assist with self feeding Compensations: Slow rate;Small sips/bites;Follow solids with liquid;Clear throat intermittently Postural Changes and/or Swallow Maneuvers: Seated upright 90 degrees              Oral Care Recommendations: Oral care BID Follow up Recommendations:  Skilled Nursing facility Plan: All goals met   Belinda Richardson, [place] CCC/SLP Pager [phone]      Belinda Richardson 04/27/2015, 12:29 PM

## 2015-04-27 NOTE — Progress Notes (Signed)
PROGRESS NOTE  Belinda Richardson ZOX:[id number] DOB: [birth year] DOA: 04/25/2015 PCP: No PCP Per Patient  HPI: 81 y.o. female with a histotry of HTN and Dementia who presents to the ED with poor intake of foods and liquids and increased weakness 2 days prior to admission. She was found to have a UTI and CAP and was admitted for IV antibiotics.   Subjective / 24 H Interval events - alert this morning, oriented to person only - no chest pain, shortness of breath, no abdominal pain, nausea or vomiting.   Assessment/Plan: Active Problems:   Sepsis   UTI (lower urinary tract infection)   CAP (community acquired pneumonia)   Weakness   Hypertension   Dementia   Community acquired pneumonia   Pressure ulcer   Malnutrition of moderate degree   Sepsis in the setting of UTI / CAP - continue Ceftriaxone and Azithromycin. - urine cultures, blood cultures pending, negative so fat  UTI - UA positive for an infection, cultures pending. On Ceftriaxone  CAP - CXR with left upper lobe pneumonia - SLP evaluation recommending Dysphagia 3 - antibiotics as above  HTN - history of HTN, not on any medications, currently blood pressure is fair  Moderate malnutrition - nutrition consult  Dementia - PT evaluation pending   Diet: DIET DYS 3 Room service appropriate?: Yes; Fluid consistency:: Thin Fluids: NS DVT Prophylaxis: Lovenox  Code Status: DNR Family Communication: no family bedside  Disposition Plan: remain inpatient, PT to evaluate  Consultants:  None   Procedures:  None    Antibiotics Ceftriaxone 8/24 >> Azithromycin 8/24 >>   Studies  Dg Chest 1 View  04/25/2015   CLINICAL DATA:  Dimension, decreased appetite.  EXAM: CHEST  1 VIEW  COMPARISON:  None.  FINDINGS: Normal cardiac silhouette. There is hazy airspace opacity in the LEFT upper lobe superior to the aortic large. There is a mild interstitial pattern in the lower lobes. No pneumothorax.  IMPRESSION: 1. Concern  for LEFT upper lobe pneumonia or aspiration pneumonitis. 2. Underlying and mild interstitial edema / venous congestion.   Electronically Signed   By: Genevive Bi M.D.   On: 04/25/2015 17:42   Ct Head Wo Contrast  04/25/2015   CLINICAL DATA:  Dementia. Marisue Ivan at home with daughter. Mental status change from baseline according to the daughter.  EXAM: CT HEAD WITHOUT CONTRAST  TECHNIQUE: Contiguous axial images were obtained from the base of the skull through the vertex without intravenous contrast.  COMPARISON:  None.  FINDINGS: There is no evidence of mass effect, midline shift, or extra-axial fluid collections. There is no evidence of a space-occupying lesion or intracranial hemorrhage. There is no evidence of a cortical-based area of acute infarction. There is an age-indeterminate lacunar infarct in thalamus bilaterally. There is an age-indeterminate right basal ganglia lacunar infarct. There is generalized cerebral atrophy. There is periventricular white matter low attenuation likely secondary to microangiopathy.  The ventricles and sulci are appropriate for the patient's age. The basal cisterns are patent.  Visualized portions of the orbits are unremarkable. There is cerumen in the right external auditory canal. The visualized portions of the paranasal sinuses and mastoid air cells are unremarkable. Cerebrovascular atherosclerotic calcifications are noted.  The osseous structures are unremarkable.  IMPRESSION: 1. Age-indeterminate lacunar infarct in thalamus bilaterally. 2. Age-indeterminate right basal ganglia lacunar infarct. 3. Chronic microvascular disease and cerebral atrophy.   Electronically Signed   By: Elige Ko   On: 04/25/2015 17:36   Dg Swallowing Func-speech Pathology  04/26/2015    Objective Swallowing Evaluation:    Patient Details  Name: Belinda Richardson MRN: [id number] Date of Birth: [birth year]  Today's Date: 04/26/2015 Time: SLP Start Time (ACUTE ONLY): 1410-SLP Stop Time (ACUTE ONLY):  1438 SLP Time Calculation (min) (ACUTE ONLY): 28 min  Past Medical History:  Past Medical History  Diagnosis Date  . Hypertension   . Dementia    Past Surgical History: No past surgical history on file. HPI:  Other Pertinent Information: 81 y.o. female with a histotry of HTN and  Dementia who presents to the ED with poor intake of foods and liquids and  increased weakness for the past 2 days. She lives with her daughter and  moved from [place] 1 year ago and has not established with a PCP yet.  She is confused at baseline, and her daughter reported to dietician that  she is at her baseline at this time per RD note. Concern for left  upper lobe pna or aspiration pneumonitis noted on CXR.  Swallow evaluation  ordered (MBS).    No Data Recorded  Assessment / Plan / Recommendation CHL IP CLINICAL IMPRESSIONS 04/26/2015  Therapy Diagnosis Mild oral phase dysphagia;Mild pharyngeal phase  dysphagia  Clinical Impression Mild oropharyngeal dysphagia with decreased oral bolus  cohesion due to discoordination resulting in minimal residuals after  swallow.  Mastication of cracker was laborious for patient evidenced by  delay and vertical mastication pattern.  Pharyngeal swallow was mildly  delayed to valleuclar space - spilling over epiglottis due to premature  spillage and sensory deficits.  TRACE SILENT aspiration of thin x1 of 5  boluses noted that did not fully clear with cued cough.  Use of straw  prevented aspiration  - likely due to better oral control and decreased  premature spillage.  Skilled intervention included determining effective  compensation strategies.  Cued throat clear and dry swallows faciliate  clearance.     Educated pt to findings using live video, however her hearing loss and  cognitive impairment impact her receptive communication and recall.  Of  note, suspect component of esophageal deficits with appearance of delayed  clearance- radiologist not present to confirm.  Thin water appeared to aid   clearance - suspect pt may be at more risk of aspiration with nectar thick  consistencies due to suspected esophageal isues/ ? decreased clearance.    Recommend dys3/thin with strict precautions - using straws for liquids.  SLP to follow up to educate family/pt to mitigation strategies for  dysphagia management.  Thanks for this consult. Ruel Favors IP TREATMENT RECOMMENDATION 04/26/2015  Treatment Recommendations Therapy as outlined in treatment plan below     CHL IP DIET RECOMMENDATION 04/26/2015  SLP Diet Recommendations Dysphagia 3 (Mech soft);Thin  Liquid Administration via Straws  Medication Administration Whole meds with puree  Compensations Slow rate;Small sips/bites;Follow solids with liquid;Clear  throat intermittently  Postural Changes and/or Swallow Maneuvers Reflux precautions     CHL IP OTHER RECOMMENDATIONS 04/26/2015  Recommended Consults (None)  Oral Care Recommendations Oral care BID  Other Recommendations (None)     No flowsheet data found.   CHL IP FREQUENCY AND DURATION 04/26/2015  Speech Therapy Frequency (ACUTE ONLY) min 1 x/week  Treatment Duration 1 week         CHL IP REASON FOR REFERRAL 04/26/2015  Reason for Referral Objectively evaluate swallowing function     CHL IP ORAL PHASE 04/26/2015  Lips (None)  Tongue (None)  Mucous  membranes (None)  Nutritional status (None)  Other (None)  Oxygen therapy (None)  Oral Phase Impaired  Oral - Pudding Teaspoon (None)  Oral - Pudding Cup (None)  Oral - Honey Teaspoon (None)  Oral - Honey Cup (None)  Oral - Honey Syringe (None)  Oral - Nectar Teaspoon (None)  Oral - Nectar Cup (None)  Oral - Nectar Straw (None)  Oral - Nectar Syringe (None)  Oral - Ice Chips (None)  Oral - Thin Teaspoon (None)  Oral - Thin Cup (None)  Oral - Thin Straw (None)  Oral - Thin Syringe (None)  Oral - Puree (None)  Oral - Mechanical Soft (None)  Oral - Regular (None)  Oral - Multi-consistency (None)  Oral - Pill (None)  Oral Phase - Comment (None)      CHL IP PHARYNGEAL PHASE  04/26/2015  Pharyngeal Phase Impaired  Pharyngeal - Pudding Teaspoon (None)  Penetration/Aspiration details (pudding teaspoon) (None)  Pharyngeal - Pudding Cup (None)  Penetration/Aspiration details (pudding cup) (None)  Pharyngeal - Honey Teaspoon (None)  Penetration/Aspiration details (honey teaspoon) (None)  Pharyngeal - Honey Cup (None)  Penetration/Aspiration details (honey cup) (None)  Pharyngeal - Honey Syringe (None)  Penetration/Aspiration details (honey syringe) (None)  Pharyngeal - Nectar Teaspoon (None)  Penetration/Aspiration details (nectar teaspoon) (None)  Pharyngeal - Nectar Cup (None)  Penetration/Aspiration details (nectar cup) (None)  Pharyngeal - Nectar Straw (None)  Penetration/Aspiration details (nectar straw) (None)  Pharyngeal - Nectar Syringe (None)  Penetration/Aspiration details (nectar syringe) (None)  Pharyngeal - Ice Chips (None)  Penetration/Aspiration details (ice chips) (None)  Pharyngeal - Thin Teaspoon (None)  Penetration/Aspiration details (thin teaspoon) (None)  Pharyngeal - Thin Cup (None)  Penetration/Aspiration details (thin cup) (None)  Pharyngeal - Thin Straw (None)  Penetration/Aspiration details (thin straw) (None)  Pharyngeal - Thin Syringe (None)  Penetration/Aspiration details (thin syringe') (None)  Pharyngeal - Puree (None)  Penetration/Aspiration details (puree) (None)  Pharyngeal - Mechanical Soft (None)  Penetration/Aspiration details (mechanical soft) (None)  Pharyngeal - Regular (None)  Penetration/Aspiration details (regular) (None)  Pharyngeal - Multi-consistency (None)  Penetration/Aspiration details (multi-consistency) (None)  Pharyngeal - Pill (None)  Penetration/Aspiration details (pill) (None)  Pharyngeal Comment very trace aspiration of thin via cup, straw sips  resulted in trace penetration that cleared with cued throat clearing      CHL IP CERVICAL ESOPHAGEAL PHASE 04/26/2015  Cervical Esophageal Phase Impaired  Pudding Teaspoon (None)  Pudding Cup  (None)  Honey Teaspoon (None)  Honey Cup (None)  Honey Straw (None)  Nectar Teaspoon (None)  Nectar Cup [facility]  Nectar Straw (None)  Nectar Sippy Cup (None)  Thin Teaspoon (None)  Thin Cup WFL  Thin Straw WFL  Thin Sippy Cup (None)  Cervical Esophageal Comment barium tablet given with pudding appeared to  stall in distal esophagus without pt awareness, intake of water faciliated  clearance, appeared of slow clearance at proximal esophagus without  awareness - thin water aided clearance, radiologist not present to confirm  findings    No flowsheet data found.         Donavan Burnet, MS [facility] SLP [phone]     Objective  Filed Vitals:   04/26/15 1452 04/26/15 2054 04/27/15 0439 04/27/15 1050  BP: 160/83 124/66 157/83 141/70  Pulse: 82 76 77 72  Temp: 97.9 F (36.6 C) 98.2 F (36.8 C) 98.2 F (36.8 C) 97.8 F (36.6 C)  TempSrc: Oral Axillary Oral Oral  Resp: Height:      Weight:  SpO2: 98% 95% 100% 100%    Intake/Output Summary (Last 24 hours) at 04/27/15 1055 Last data filed at 04/27/15 0930  Gross per 24 hour  Intake   2055 ml  Output      0 ml  Net   2055 ml   Filed Weights   04/25/15 2005  Weight: 52.2 kg (115 lb 1.3 oz)    Exam:  GENERAL: NAD, pleasantly confused  HEENT:  no scleral icterus.   NECK: Supple.   LUNGS: Clear to auscultation. No wheezing or crackles  HEART: Regular rate and rhythm without murmur. 2+ pulses, no JVD, no peripheral edema  ABDOMEN: Soft, nontender, and nondistended. Positive bowel sounds.  NEUROLOGIC: Alert and oriented x1. Strength 5/5 in all 4.  Data Reviewed: Basic Metabolic Panel:  Recent Labs Lab 04/25/15 1656 04/26/15 0535  NA 140 141  K 3.7 3.9  CL 104 111  CO2 28 23  GLUCOSE 121* 91  BUN 18 16  CREATININE 0.73 0.56  CALCIUM 9.3 8.6*   Liver Function Tests:  Recent Labs Lab 04/25/15 1656  AST 19  ALT 9*  ALKPHOS 86  BILITOT 0.8  PROT 7.8  ALBUMIN 3.2*   CBC:  Recent Labs Lab 04/25/15 1656  04/26/15 0535  WBC 11.7* 8.4  HGB 12.6 10.5*  HCT 38.8 32.5*  MCV 88.8 89.5  PLT 278 219    Recent Results (from the past 240 hour(s))  Urine culture     Status: None (Preliminary result)   Collection Time: 04/25/15  6:17 PM  Result Value Ref Range Status   Specimen Description URINE, RANDOM  Final   Special Requests Normal  Final   Culture   Final    CULTURE REINCUBATED FOR BETTER GROWTH Performed at [facility]    Report Status PENDING  Incomplete  Blood Culture (routine x 2)     Status: None (Preliminary result)   Collection Time: 04/25/15  6:38 PM  Result Value Ref Range Status   Specimen Description BLOOD BLOOD RIGHT FOREARM  Final   Special Requests BOTTLES DRAWN AEROBIC AND ANAEROBIC 5CC EACH  Final   Culture   Final    NO GROWTH < 24 HOURS Performed at [facility]    Report Status PENDING  Incomplete     Scheduled Meds: . antiseptic oral rinse  7 mL Mouth Rinse q12n4p  . azithromycin  500 mg Intravenous Q24H  . cefTRIAXone (ROCEPHIN)  IV  1 g Intravenous Q24H  . chlorhexidine  15 mL Mouth Rinse BID  . enoxaparin (LOVENOX) injection  40 mg Subcutaneous Q24H  . pantoprazole (PROTONIX) IV  40 mg Intravenous QHS   Continuous Infusions:     Pamella Pert, MD [facility] Pager [phone]. If 7 PM - 7 AM, please contact night-coverage at [email or web address], password [facility] 04/27/2015, 10:55 AM  LOS: 2 days

## 2015-04-27 NOTE — Clinical Social Work Placement (Signed)
Patient to discharge to [facility] when stable. Heather at [employer] aware. If ready over the weekend, please contact weekend CSW, Rene Kocher (ph#: [phone]) to facilitate discharge.      Lincoln Maxin, LCSW [facility] Clinical Social Worker cell #: [phone]     CLINICAL SOCIAL WORK PLACEMENT  NOTE  Date:  04/27/2015  Patient Details  Name: Belinda Richardson MRN: [id number] Date of Birth: [birth year]  Clinical Social Work is seeking post-discharge placement for this patient at the Skilled  Nursing Facility level of care (*CSW will initial, date and re-position this form in  chart as items are completed):  Yes   Patient/family provided with [facility] Clinical Social Work Department's list of facilities offering this level of care within the geographic area requested by the patient (or if unable, by the patient's family).  Yes   Patient/family informed of their freedom to choose among providers that offer the needed level of care, that participate in Medicare, Medicaid or managed care program needed by the patient, have an available bed and are willing to accept the patient.  Yes   Patient/family informed of [facility]'s ownership interest in [facility] and [facility], as well as of the fact that they are under no obligation to receive care at these facilities.  PASRR submitted to EDS on 04/27/15     PASRR number received on 04/27/15     Existing PASRR number confirmed on       FL2 transmitted to all facilities in geographic area requested by pt/family on 04/27/15     FL2 transmitted to all facilities within larger geographic area on       Patient informed that his/her managed care company has contracts with or will negotiate with certain facilities, including the following:        Yes   Patient/family informed of bed offers received.  Patient chooses bed at [facility]     Physician recommends and patient chooses bed at       Patient to be transferred to [facility] on  .  Patient to be transferred to facility by       Patient family notified on   of transfer.  Name of family member notified:        PHYSICIAN       Additional Comment:    _______________________________________________ Arlyss Repress, LCSW 04/27/2015, 3:27 PM

## 2015-04-27 NOTE — Plan of Care (Signed)
Problem: Phase I Progression Outcomes Goal: OOB as tolerated unless otherwise ordered Outcome: Progressing Pt moved from bed to chair with PT and use of a walker

## 2015-04-27 NOTE — Clinical Social Work Note (Signed)
Clinical Social Work Assessment  Patient Details  Name: Belinda Richardson MRN: [id number] Date of Birth: April 19, 1921  Date of referral:  04/27/15               Reason for consult:  Facility Placement                Permission sought to share information with:  Oceanographer granted to share information::  Yes, Verbal Permission Granted  Name::        Agency::     Relationship::     Contact Information:     Housing/Transportation Living arrangements for the past 2 months:  Single Family Home Source of Information:  Adult Children Patient Interpreter Needed:  None Criminal Activity/Legal Involvement Pertinent to Current Situation/Hospitalization:  No - Comment as needed Significant Relationships:  Adult Children Lives with:  Adult Children Do you feel safe going back to the place where you live?  No Need for family participation in patient care:  Yes (Comment)  Care giving concerns:  CSW reviewed PT evaluation recommending SNF at discharge.    Social Worker assessment / plan:  CSW spoke with patient's daughter, [name] re: discharge plans - daughter is agreeable with plan for SNF.   Employment status:  Retired Health and safety inspector:  Medicare PT Recommendations:  Skilled Nursing Facility Information / Referral to community resources:  Skilled Nursing Facility  Patient/Family's Response to care:  Patient's daughter informed CSW that patient lives with her and she takes care of her. Patient was not able to do a whole lot without daughter's assistance but daughter feels that she would be able to take care of her at home.   Patient/Family's Understanding of and Emotional Response to Diagnosis, Current Treatment, and Prognosis:  Patient's daughter is concerned about patient having UTI.   Emotional Assessment Appearance:  Appears stated age Attitude/Demeanor/Rapport:    Affect (typically observed):  Calm Orientation:  Oriented to Self Alcohol / Substance  use:    Psych involvement (Current and /or in the community):     Discharge Needs  Concerns to be addressed:    Readmission within the last 30 days:    Current discharge risk:    Barriers to Discharge:      Arlyss Repress, LCSW 04/27/2015, 3:19 PM

## 2015-04-27 NOTE — Care Management Important Message (Signed)
Important Message  Patient Details  Name: Belinda Richardson MRN: [id number] Date of Birth: May 09, 1921   Medicare Important Message Given:  Yes-second notification given    Haskell Flirt 04/27/2015, 1:24 PMImportant Message  Patient Details  Name: Belinda Richardson MRN: [id number] Date of Birth: Jan 01, 1921   Medicare Important Message Given:  Yes-second notification given    Haskell Flirt 04/27/2015, 1:24 PM

## 2015-04-27 NOTE — Evaluation (Signed)
Physical Therapy Evaluation Patient Details Name: Belinda Richardson MRN: [id number] DOB: Feb 28, 1921 Today's Date: 04/27/2015   History of Present Illness  81 yo female admitted with sepsis, weakness. Hx of HTn, dementia.   Clinical Impression  On eval, pt required Mod assist +2 for mobility-pt pivoted from bed to recliner with RW. Pt appeared to have pain in LEs with WBing but she was unable to tell me where. No family present during session. May need to consider SNF at this time.     Follow Up Recommendations Home health PT;Supervision/Assistance - 24 hour vs SNF (depending on progress and daughter's ability to provide current level of care)    Equipment Recommendations  None recommended by PT    Recommendations for Other Services       Precautions / Restrictions Precautions Precautions: Fall Restrictions Weight Bearing Restrictions: No      Mobility  Bed Mobility Overal bed mobility: Needs Assistance Bed Mobility: Supine to Sit     Supine to sit: HOB elevated;Mod assist;+2 for physical assistance;+2 for safety/equipment     General bed mobility comments: Assist for trunk and LEs. Increased time. Multimodal cues for participation-pt initiated task but did not complete it. Utilized bedpad for scooting, positioning.   Transfers Overall transfer level: Needs assistance Equipment used: Rolling walker (2 wheeled) Transfers: Sit to/from [employer] Sit to Stand: Mod assist;+2 physical assistance;+2 safety/equipment;From elevated surface         General transfer comment: Assist to rise, stabilize, control descent. Multimodal cues for safety, hand placement. Stand pivot from bed to recliner with RW.   Ambulation/Gait             General Gait Details: NT-pt unable on today  Stairs            Wheelchair Mobility    Modified Rankin (Stroke Patients Only)       Balance Overall balance assessment: Needs assistance         Standing balance  support: Bilateral upper extremity supported;During functional activity Standing balance-Leahy Scale: Poor                               Pertinent Vitals/Pain Pain Assessment: Faces Faces Pain Scale: Hurts even more Pain Location: LEs when standing but pt unable to state what is exactly hurting Pain Descriptors / Indicators: Guarding;Grimacing Pain Intervention(s): Limited activity within patient's tolerance;Monitored during session;Repositioned    Home Living Family/patient expects to be discharged to:: Unsure Living Arrangements: Children             Home Equipment: Dan Humphreys - 2 wheels Additional Comments: pt unable to provide PLOF/home environment info. Information entered was obtained from chart    Prior Function Level of Independence: Needs assistance               Hand Dominance        Extremity/Trunk Assessment   Upper Extremity Assessment: Generalized weakness           Lower Extremity Assessment: Generalized weakness      Cervical / Trunk Assessment: Kyphotic  Communication   Communication: No difficulties  Cognition Arousal/Alertness: Awake/alert Behavior During Therapy: WFL for tasks assessed/performed Overall Cognitive Status: History of cognitive impairments - at baseline                      General Comments      Exercises        Assessment/Plan  PT Assessment Patient needs continued PT services  PT Diagnosis Difficulty walking;Abnormality of gait;Generalized weakness;Acute pain   PT Problem List Decreased strength;Decreased activity tolerance;Decreased balance;Decreased mobility;Decreased knowledge of use of DME;Pain;Decreased cognition;Decreased safety awareness  PT Treatment Interventions DME instruction;Gait training;Functional mobility training;Therapeutic activities;Patient/family education;Balance training;Therapeutic exercise   PT Goals (Current goals can be found in the Care Plan section) Acute Rehab  PT Goals Patient Stated Goal: none stated-pt unable PT Goal Formulation: Patient unable to participate in goal setting Time For Goal Achievement: 05/11/15 Potential to Achieve Goals: Fair    Frequency Min 3X/week   Barriers to discharge        Co-evaluation               End of Session Equipment Utilized During Treatment: Gait belt Activity Tolerance: Patient limited by pain Patient left: in chair;with call bell/phone within reach;with chair alarm set           Time: 2841-3244 PT Time Calculation (min) (ACUTE ONLY): 12 min   Charges:   PT Evaluation $Initial PT Evaluation Tier I: 1 Procedure     PT G Codes:        Rebeca Alert, MPT Pager: [phone]

## 2015-04-28 LAB — URINE CULTURE
Culture: >=100000
SPECIAL REQUESTS: NORMAL

## 2015-04-28 MED ORDER — LEVOFLOXACIN 500 MG PO TABS: 500.0000 mg | ORAL_TABLET | Freq: Every day | ORAL | Status: DC

## 2015-04-28 MED ORDER — AMLODIPINE BESYLATE 2.5 MG PO TABS: 2.5000 mg | ORAL_TABLET | Freq: Every day | ORAL | Status: DC

## 2015-04-28 MED ORDER — PANTOPRAZOLE SODIUM 40 MG IV SOLR: 40.0000 mg | Freq: Every day | INTRAVENOUS | Status: DC

## 2015-04-28 MED ORDER — PANTOPRAZOLE SODIUM 20 MG PO TBEC: 20.0000 mg | DELAYED_RELEASE_TABLET | Freq: Every day | ORAL | Status: DC

## 2015-04-28 NOTE — Discharge Summary (Addendum)
Physician Discharge Summary  Belinda Richardson ZOX:[id number] DOB: 08-19-1921 DOA: 04/25/2015  PCP: No PCP Per Patient  Admit date: 04/25/2015 Discharge date: 04/28/2015  Time spent: > 30 minutes  Recommendations for Outpatient Follow-up:  1. Follow up with SNF MD in 1-2 weeks 2. Continue Levofloxacin for 4 additional days  3. Continue dysphagia 3 diet  Discharge Diagnoses:  Active Problems:   Sepsis   UTI (lower urinary tract infection)   CAP (community acquired pneumonia)   Weakness   Hypertension   Dementia   Community acquired pneumonia   Pressure ulcer   Malnutrition of moderate degree  Discharge Condition: stable  Diet recommendation: dysphagia 3  Filed Weights   04/25/15 2005  Weight: 52.2 kg (115 lb 1.3 oz)   History of present illness:  Belinda Richardson is a 81 y.o. female with a histotry of HTN and Dementia who presents to the ED with poor intake of foods and liquids and increased weakness for the past 2 days. She lives with her daughter and moved from [place] 1 year ago and has not established with a PCP yet. She is unable to give her history , and her daughter gives the history. She is confused at baseline, and her daughter reports that she is at her baseline at this time.   Hospital Course:  Sepsis in the setting of UTI / CAP - patient was started on Ceftriaxone and Azithromycin with improvement in her sepsis physiology, she has remained stable, her antibiotics were narrowed to Levaquin for which she is to continue for 4 additional days. Given dementia and concern for aspiration, SLP evaluated patient and recommended Dysphagia 3 diet. Her respiratory status has remained stable and she has maintained good oxygen saturations on room air.  UTI - UA positive for E coli, narrowed to Levaquin on discharge based on sensitivities.  CAP - CXR with left upper lobe pneumonia. Antibiotics as above HTN - history of HTN, not on any medications, she was started on low  dose Amlodipine, please continue to monitor and further adjust if needed. Moderate malnutrition  Dementia  Procedures:  None    Consultations:  None   Discharge Exam: Filed Vitals:   04/27/15 1809 04/27/15 2152 04/28/15 0141 04/28/15 0425  BP: 174/78 150/91 151/81 157/90  Pulse: 80 81 90 83  Temp: 98 F (36.7 C)  98.3 F (36.8 C) 98.6 F (37 C)  TempSrc: Oral  Axillary Oral  Resp: 18 18 18 18   Height:      Weight:      SpO2: 100% 98%  97%   General: NAD Cardiovascular: RRR Respiratory: CTA biL  Discharge Instructions     Medication List    TAKE these medications        amLODipine 2.5 MG tablet  Commonly known as:  NORVASC  Take 1 tablet (2.5 mg total) by mouth daily.     levofloxacin 500 MG tablet  Commonly known as:  LEVAQUIN  Take 1 tablet (500 mg total) by mouth daily. For 4 additional days     pantoprazole 20 MG tablet  Commonly known as:  PROTONIX  Take 1 tablet (20 mg total) by mouth daily.         The results of significant diagnostics from this hospitalization (including imaging, microbiology, ancillary and laboratory) are listed below for reference.    Significant Diagnostic Studies: Dg Chest 1 View  04/25/2015   CLINICAL DATA:  Dimension, decreased appetite.  EXAM: CHEST  1 VIEW  COMPARISON:  None.  FINDINGS: Normal cardiac silhouette. There is hazy airspace opacity in the LEFT upper lobe superior to the aortic large. There is a mild interstitial pattern in the lower lobes. No pneumothorax.  IMPRESSION: 1. Concern for LEFT upper lobe pneumonia or aspiration pneumonitis. 2. Underlying and mild interstitial edema / venous congestion.   Electronically Signed   By: Genevive Bi M.D.   On: 04/25/2015 17:42   Ct Head Wo Contrast  04/25/2015   CLINICAL DATA:  Dementia. Marisue Ivan at home with daughter. Mental status change from baseline according to the daughter.  EXAM: CT HEAD WITHOUT CONTRAST  TECHNIQUE: Contiguous axial images were obtained from the  base of the skull through the vertex without intravenous contrast.  COMPARISON:  None.  FINDINGS: There is no evidence of mass effect, midline shift, or extra-axial fluid collections. There is no evidence of a space-occupying lesion or intracranial hemorrhage. There is no evidence of a cortical-based area of acute infarction. There is an age-indeterminate lacunar infarct in thalamus bilaterally. There is an age-indeterminate right basal ganglia lacunar infarct. There is generalized cerebral atrophy. There is periventricular white matter low attenuation likely secondary to microangiopathy.  The ventricles and sulci are appropriate for the patient's age. The basal cisterns are patent.  Visualized portions of the orbits are unremarkable. There is cerumen in the right external auditory canal. The visualized portions of the paranasal sinuses and mastoid air cells are unremarkable. Cerebrovascular atherosclerotic calcifications are noted.  The osseous structures are unremarkable.  IMPRESSION: 1. Age-indeterminate lacunar infarct in thalamus bilaterally. 2. Age-indeterminate right basal ganglia lacunar infarct. 3. Chronic microvascular disease and cerebral atrophy.   Electronically Signed   By: Elige Ko   On: 04/25/2015 17:36   Dg Swallowing Func-speech Pathology  04/26/2015    Objective Swallowing Evaluation:    Patient Details  Name: Belinda Richardson MRN: [id number] Date of Birth: 05/08/1921  Today's Date: 04/26/2015 Time: SLP Start Time (ACUTE ONLY): 1410-SLP Stop Time (ACUTE ONLY): 1438 SLP Time Calculation (min) (ACUTE ONLY): 28 min  Past Medical History:  Past Medical History  Diagnosis Date  . Hypertension   . Dementia    Past Surgical History: No past surgical history on file. HPI:  Other Pertinent Information: 81 y.o. female with a histotry of HTN and  Dementia who presents to the ED with poor intake of foods and liquids and  increased weakness for the past 2 days. She lives with her daughter and  moved from  [place] 1 year ago and has not established with a PCP yet.  She is confused at baseline, and her daughter reported to dietician that  she is at her baseline at this time per RD note. Concern for left  upper lobe pna or aspiration pneumonitis noted on CXR.  Swallow evaluation  ordered (MBS).    No Data Recorded  Assessment / Plan / Recommendation CHL IP CLINICAL IMPRESSIONS 04/26/2015  Therapy Diagnosis Mild oral phase dysphagia;Mild pharyngeal phase  dysphagia  Clinical Impression Mild oropharyngeal dysphagia with decreased oral bolus  cohesion due to discoordination resulting in minimal residuals after  swallow.  Mastication of cracker was laborious for patient evidenced by  delay and vertical mastication pattern.  Pharyngeal swallow was mildly  delayed to valleuclar space - spilling over epiglottis due to premature  spillage and sensory deficits.  TRACE SILENT aspiration of thin x1 of 5  boluses noted that did not fully clear with cued cough.  Use of straw  prevented aspiration  - likely due to better oral  control and decreased  premature spillage.  Skilled intervention included determining effective  compensation strategies.  Cued throat clear and dry swallows faciliate  clearance.     Educated pt to findings using live video, however her hearing loss and  cognitive impairment impact her receptive communication and recall.  Of  note, suspect component of esophageal deficits with appearance of delayed  clearance- radiologist not present to confirm.  Thin water appeared to aid  clearance - suspect pt may be at more risk of aspiration with nectar thick  consistencies due to suspected esophageal isues/ ? decreased clearance.    Recommend dys3/thin with strict precautions - using straws for liquids.  SLP to follow up to educate family/pt to mitigation strategies for  dysphagia management.  Thanks for this consult. Ruel Favors IP TREATMENT RECOMMENDATION 04/26/2015  Treatment Recommendations Therapy as outlined in  treatment plan below     CHL IP DIET RECOMMENDATION 04/26/2015  SLP Diet Recommendations Dysphagia 3 (Mech soft);Thin  Liquid Administration via Straws  Medication Administration Whole meds with puree  Compensations Slow rate;Small sips/bites;Follow solids with liquid;Clear  throat intermittently  Postural Changes and/or Swallow Maneuvers Reflux precautions     CHL IP OTHER RECOMMENDATIONS 04/26/2015  Recommended Consults (None)  Oral Care Recommendations Oral care BID  Other Recommendations (None)     No flowsheet data found.   CHL IP FREQUENCY AND DURATION 04/26/2015  Speech Therapy Frequency (ACUTE ONLY) min 1 x/week  Treatment Duration 1 week         CHL IP REASON FOR REFERRAL 04/26/2015  Reason for Referral Objectively evaluate swallowing function     CHL IP ORAL PHASE 04/26/2015  Lips (None)  Tongue (None)  Mucous membranes (None)  Nutritional status (None)  Other (None)  Oxygen therapy (None)  Oral Phase Impaired  Oral - Pudding Teaspoon (None)  Oral - Pudding Cup (None)  Oral - Honey Teaspoon (None)  Oral - Honey Cup (None)  Oral - Honey Syringe (None)  Oral - Nectar Teaspoon (None)  Oral - Nectar Cup (None)  Oral - Nectar Straw (None)  Oral - Nectar Syringe (None)  Oral - Ice Chips (None)  Oral - Thin Teaspoon (None)  Oral - Thin Cup (None)  Oral - Thin Straw (None)  Oral - Thin Syringe (None)  Oral - Puree (None)  Oral - Mechanical Soft (None)  Oral - Regular (None)  Oral - Multi-consistency (None)  Oral - Pill (None)  Oral Phase - Comment (None)      CHL IP PHARYNGEAL PHASE 04/26/2015  Pharyngeal Phase Impaired  Pharyngeal - Pudding Teaspoon (None)  Penetration/Aspiration details (pudding teaspoon) (None)  Pharyngeal - Pudding Cup (None)  Penetration/Aspiration details (pudding cup) (None)  Pharyngeal - Honey Teaspoon (None)  Penetration/Aspiration details (honey teaspoon) (None)  Pharyngeal - Honey Cup (None)  Penetration/Aspiration details (honey cup) (None)  Pharyngeal - Honey Syringe (None)   Penetration/Aspiration details (honey syringe) (None)  Pharyngeal - Nectar Teaspoon (None)  Penetration/Aspiration details (nectar teaspoon) (None)  Pharyngeal - Nectar Cup (None)  Penetration/Aspiration details (nectar cup) (None)  Pharyngeal - Nectar Straw (None)  Penetration/Aspiration details (nectar straw) (None)  Pharyngeal - Nectar Syringe (None)  Penetration/Aspiration details (nectar syringe) (None)  Pharyngeal - Ice Chips (None)  Penetration/Aspiration details (ice chips) (None)  Pharyngeal - Thin Teaspoon (None)  Penetration/Aspiration details (thin teaspoon) (None)  Pharyngeal - Thin Cup (None)  Penetration/Aspiration details (thin cup) (None)  Pharyngeal - Thin Straw (None)  Penetration/Aspiration details (  thin straw) (None)  Pharyngeal - Thin Syringe (None)  Penetration/Aspiration details (thin syringe') (None)  Pharyngeal - Puree (None)  Penetration/Aspiration details (puree) (None)  Pharyngeal - Mechanical Soft (None)  Penetration/Aspiration details (mechanical soft) (None)  Pharyngeal - Regular (None)  Penetration/Aspiration details (regular) (None)  Pharyngeal - Multi-consistency (None)  Penetration/Aspiration details (multi-consistency) (None)  Pharyngeal - Pill (None)  Penetration/Aspiration details (pill) (None)  Pharyngeal Comment very trace aspiration of thin via cup, straw sips  resulted in trace penetration that cleared with cued throat clearing      CHL IP CERVICAL ESOPHAGEAL PHASE 04/26/2015  Cervical Esophageal Phase Impaired  Pudding Teaspoon (None)  Pudding Cup (None)  Honey Teaspoon (None)  Honey Cup (None)  Honey Straw (None)  Nectar Teaspoon (None)  Nectar Cup [facility]  Nectar Straw (None)  Nectar Sippy Cup (None)  Thin Teaspoon (None)  Thin Cup WFL  Thin Straw WFL  Thin Sippy Cup (None)  Cervical Esophageal Comment barium tablet given with pudding appeared to  stall in distal esophagus without pt awareness, intake of water faciliated  clearance, appeared of slow clearance at proximal  esophagus without  awareness - thin water aided clearance, radiologist not present to confirm  findings    No flowsheet data found.         Donavan Burnet, MS [facility] SLP [phone]    Microbiology: Recent Results (from the past 240 hour(s))  Urine culture     Status: None   Collection Time: 04/25/15  6:17 PM  Result Value Ref Range Status   Specimen Description URINE, RANDOM  Final   Special Requests Normal  Final   Culture   Final    >=100,000 COLONIES/mL ESCHERICHIA COLI Performed at [facility]    Report Status 04/28/2015 FINAL  Final   Organism ID, Bacteria ESCHERICHIA COLI  Final      Susceptibility   Escherichia coli - MIC*    AMPICILLIN <=2 SENSITIVE Sensitive     CEFAZOLIN <=4 SENSITIVE Sensitive     CEFTRIAXONE <=1 SENSITIVE Sensitive     CIPROFLOXACIN <=0.25 SENSITIVE Sensitive     GENTAMICIN 4 SENSITIVE Sensitive     IMIPENEM 1 SENSITIVE Sensitive     NITROFURANTOIN <=16 SENSITIVE Sensitive     TRIMETH/SULFA >=320 RESISTANT Resistant     AMPICILLIN/SULBACTAM <=2 SENSITIVE Sensitive     PIP/TAZO <=4 SENSITIVE Sensitive     * >=100,000 COLONIES/mL ESCHERICHIA COLI  Blood Culture (routine x 2)     Status: None (Preliminary result)   Collection Time: 04/25/15  6:38 PM  Result Value Ref Range Status   Specimen Description BLOOD BLOOD RIGHT FOREARM  Final   Special Requests BOTTLES DRAWN AEROBIC AND ANAEROBIC 5CC EACH  Final   Culture   Final    NO GROWTH 2 DAYS Performed at [facility]    Report Status PENDING  Incomplete    Labs: Basic Metabolic Panel:  Recent Labs Lab 04/25/15 1656 04/26/15 0535  NA 140 141  K 3.7 3.9  CL 104 111  CO2 28 23  GLUCOSE 121* 91  BUN 18 16  CREATININE 0.73 0.56  CALCIUM 9.3 8.6*   Liver Function Tests:  Recent Labs Lab 04/25/15 1656  AST 19  ALT 9*  ALKPHOS 86  BILITOT 0.8  PROT 7.8  ALBUMIN 3.2*   CBC:  Recent Labs Lab 04/25/15 1656 04/26/15 0535  WBC 11.7* 8.4  HGB 12.6 10.5*  HCT 38.8 32.5*    MCV 88.8 89.5  PLT 278 219  SignedPamella Pert  [facility] 04/28/2015, 10:27 AM

## 2015-04-28 NOTE — Clinical Social Work Placement (Signed)
   CLINICAL SOCIAL WORK PLACEMENT  NOTE  Date:  04/28/2015  Patient Details  Name: Belinda Richardson MRN: [id number] Date of Birth: Jan 14, 1921  Clinical Social Work is seeking post-discharge placement for this patient at the Skilled  Nursing Facility level of care (*CSW will initial, date and re-position this form in  chart as items are completed):  Yes   Patient/family provided with [place] Clinical Social Work Department's list of facilities offering this level of care within the geographic area requested by the patient (or if unable, by the patient's family).  Yes   Patient/family informed of their freedom to choose among providers that offer the needed level of care, that participate in Medicare, Medicaid or managed care program needed by the patient, have an available bed and are willing to accept the patient.  Yes   Patient/family informed of [facility]'s ownership interest in [facility] and [facility], as well as of the fact that they are under no obligation to receive care at these facilities.  PASRR submitted to EDS on 04/27/15     PASRR number received on 04/27/15     Existing PASRR number confirmed on       FL2 transmitted to all facilities in geographic area requested by pt/family on 04/27/15     FL2 transmitted to all facilities within larger geographic area on       Patient informed that his/her managed care company has contracts with or will negotiate with certain facilities, including the following:        Yes   Patient/family informed of bed offers received.  Patient chooses bed at [facility] [place]     Physician recommends and patient chooses bed at      Patient to be transferred to [facility] [place] on  .April 28, 2015  Patient to be transferred to facility by   ambulance    Patient family notified on   April 28, 2015 of transfer.  Name of family member notified:    Doreen/daughter    PHYSICIAN       Additional Comment:     _______________________________________________ Annetta Maw, LCSW 04/28/2015, 4:20 PM

## 2015-04-30 LAB — CULTURE, BLOOD (ROUTINE X 2)
Culture: NO GROWTH
Culture: NO GROWTH

## 2015-06-04 ENCOUNTER — Inpatient Hospital Stay (HOSPITAL_COMMUNITY)
Admission: EM | Admit: 2015-06-04 | Discharge: 2015-06-06 | DRG: 871 | Disposition: A | Discharge status: Hospice | Payer: Medicare Other | Attending: Internal Medicine | Admitting: Internal Medicine

## 2015-06-04 ENCOUNTER — Emergency Department (HOSPITAL_COMMUNITY): Payer: Medicare Other

## 2015-06-04 ENCOUNTER — Encounter (HOSPITAL_COMMUNITY): Payer: Self-pay | Admitting: *Deleted

## 2015-06-04 DIAGNOSIS — L89312 Pressure ulcer of right buttock, stage 2: Secondary | ICD-10-CM | POA: Diagnosis present

## 2015-06-04 DIAGNOSIS — I4891 Unspecified atrial fibrillation: Secondary | ICD-10-CM | POA: Diagnosis present

## 2015-06-04 DIAGNOSIS — L89159 Pressure ulcer of sacral region, unspecified stage: Secondary | ICD-10-CM | POA: Diagnosis present

## 2015-06-04 DIAGNOSIS — Z515 Encounter for palliative care: Secondary | ICD-10-CM | POA: Diagnosis not present

## 2015-06-04 DIAGNOSIS — L89153 Pressure ulcer of sacral region, stage 3: Secondary | ICD-10-CM | POA: Diagnosis present

## 2015-06-04 DIAGNOSIS — A419 Sepsis, unspecified organism: Secondary | ICD-10-CM | POA: Diagnosis present

## 2015-06-04 DIAGNOSIS — L8990 Pressure ulcer of unspecified site, unspecified stage: Secondary | ICD-10-CM

## 2015-06-04 DIAGNOSIS — Z7189 Other specified counseling: Secondary | ICD-10-CM | POA: Diagnosis not present

## 2015-06-04 DIAGNOSIS — Z66 Do not resuscitate: Secondary | ICD-10-CM | POA: Diagnosis present

## 2015-06-04 DIAGNOSIS — I119 Hypertensive heart disease without heart failure: Secondary | ICD-10-CM | POA: Diagnosis present

## 2015-06-04 DIAGNOSIS — R509 Fever, unspecified: Secondary | ICD-10-CM

## 2015-06-04 DIAGNOSIS — E440 Moderate protein-calorie malnutrition: Secondary | ICD-10-CM

## 2015-06-04 DIAGNOSIS — F0390 Unspecified dementia without behavioral disturbance: Secondary | ICD-10-CM | POA: Diagnosis present

## 2015-06-04 DIAGNOSIS — R627 Adult failure to thrive: Secondary | ICD-10-CM | POA: Diagnosis present

## 2015-06-04 LAB — CBC WITH DIFFERENTIAL/PLATELET
Basophils Absolute: 0 10*3/uL (ref 0.0–0.1)
Basophils Relative: 0 %
EOS ABS: 0.1 10*3/uL (ref 0.0–0.7)
Eosinophils Relative: 1 %
HCT: 37.7 % (ref 36.0–46.0)
HEMOGLOBIN: 12.2 g/dL (ref 12.0–15.0)
Lymphocytes Relative: 15 %
Lymphs Abs: 1.5 10*3/uL (ref 0.7–4.0)
MCH: 28.4 pg (ref 26.0–34.0)
MCHC: 32.4 g/dL (ref 30.0–36.0)
MCV: 87.9 fL (ref 78.0–100.0)
Monocytes Absolute: 0.7 10*3/uL (ref 0.1–1.0)
Monocytes Relative: 7 %
Neutro Abs: 8.1 10*3/uL — ABNORMAL HIGH (ref 1.7–7.7)
Neutrophils Relative %: 77 %
Platelets: 250 10*3/uL (ref 150–400)
RBC: 4.29 MIL/uL (ref 3.87–5.11)
RDW: 13 % (ref 11.5–15.5)
WBC: 10.4 10*3/uL (ref 4.0–10.5)

## 2015-06-04 LAB — URINALYSIS, ROUTINE W REFLEX MICROSCOPIC
BILIRUBIN URINE: NEGATIVE
Glucose, UA: NEGATIVE mg/dL
KETONES UR: NEGATIVE mg/dL
LEUKOCYTES UA: NEGATIVE
NITRITE: NEGATIVE
PROTEIN: NEGATIVE mg/dL
Specific Gravity, Urine: 1.019 (ref 1.005–1.030)
UROBILINOGEN UA: 0.2 mg/dL (ref 0.0–1.0)
pH: 6 (ref 5.0–8.0)

## 2015-06-04 LAB — COMPREHENSIVE METABOLIC PANEL
ALBUMIN: 2.8 g/dL — AB (ref 3.5–5.0)
ALT: 8 U/L — ABNORMAL LOW (ref 14–54)
ANION GAP: 9 (ref 5–15)
AST: 14 U/L — ABNORMAL LOW (ref 15–41)
Alkaline Phosphatase: 70 U/L (ref 38–126)
BILIRUBIN TOTAL: 0.6 mg/dL (ref 0.3–1.2)
BUN: 16 mg/dL (ref 6–20)
CALCIUM: 8.8 mg/dL — AB (ref 8.9–10.3)
CO2: 24 mmol/L (ref 22–32)
Chloride: 102 mmol/L (ref 101–111)
Creatinine, Ser: 0.68 mg/dL (ref 0.44–1.00)
GLUCOSE: 112 mg/dL — AB (ref 65–99)
POTASSIUM: 4.1 mmol/L (ref 3.5–5.1)
Sodium: 135 mmol/L (ref 135–145)
TOTAL PROTEIN: 7 g/dL (ref 6.5–8.1)

## 2015-06-04 LAB — I-STAT CG4 LACTIC ACID, ED: LACTIC ACID, VENOUS: 1.16 mmol/L (ref 0.5–2.0)

## 2015-06-04 LAB — TROPONIN I: Troponin I: <0.03 ng/mL (ref <0.031)

## 2015-06-04 LAB — URINE MICROSCOPIC-ADD ON

## 2015-06-04 LAB — MAGNESIUM: MAGNESIUM: 2 mg/dL (ref 1.7–2.4)

## 2015-06-04 LAB — TSH: TSH: 1.919 u[IU]/mL (ref 0.350–4.500)

## 2015-06-04 MED ORDER — CEFTRIAXONE SODIUM 1 G IJ SOLR
1.0000 g | INTRAMUSCULAR | Status: DC
  Administered 2015-06-04: 1 g via INTRAVENOUS
  Filled 2015-06-04 (×2): qty 10

## 2015-06-04 MED ORDER — HEPARIN SODIUM (PORCINE) 5000 UNIT/ML IJ SOLN
5000.0000 [IU] | Freq: Three times a day (TID) | INTRAMUSCULAR | Status: DC
  Administered 2015-06-04 – 2015-06-05 (×2): 5000 [IU] via SUBCUTANEOUS
  Filled 2015-06-04 (×5): qty 1

## 2015-06-04 MED ORDER — SODIUM CHLORIDE 0.9 % IV SOLN
INTRAVENOUS | Status: DC
  Administered 2015-06-04: 23:00:00 via INTRAVENOUS

## 2015-06-04 MED ORDER — SODIUM CHLORIDE 0.9 % IV BOLUS (SEPSIS)
500.0000 mL | Freq: Once | INTRAVENOUS | Status: AC
  Administered 2015-06-04: 500 mL via INTRAVENOUS

## 2015-06-04 MED ORDER — ACETAMINOPHEN 325 MG PO TABS: 650.0000 mg | ORAL_TABLET | Freq: Four times a day (QID) | ORAL | Status: DC | PRN

## 2015-06-04 MED ORDER — ACETAMINOPHEN 650 MG RE SUPP: 650.0000 mg | RECTAL | Status: DC | PRN

## 2015-06-04 NOTE — ED Notes (Signed)
MD at bedside. 

## 2015-06-04 NOTE — ED Notes (Signed)
Report attempted to [facility]

## 2015-06-04 NOTE — H&P (Signed)
[facility] History and Physical  Belinda Richardson ZOX:[id number] DOB: July 09, 1921 DOA: 06/04/2015  Referring physician: EDP PCP: No PCP Per Patient   Chief Complaint: Adult failure to thrive   HPI: Belinda Richardson is a 81 y.o. female with end stage dementia, adult failure to thrive with no PO intake including eating or taking any meds.  Symptoms ongoing and worsening for past 3-4 months with worsening physical and mental decline.  Patient now has sacral decubitus ulcers.  Failed rehab at [facility] a couple of months ago.  LPN visited patient at home today (lives at home with daughter) and wanted patient sent here for possible placement.  Work up in ED demonstrates A.Fib RVR with rate into the 140s which improved to 120s with IV hydration.  Tm 100.9.  UA and CXR negative.  Does have decubitus ulcers.  Review of Systems: Systems reviewed.  As above, otherwise negative  Past Medical History  Diagnosis Date  . Hypertension   . Dementia    History reviewed. No pertinent past surgical history. Social History:  reports that she has never smoked. She has never used smokeless tobacco. She reports that she does not drink alcohol. Her drug history is not on file.  No Known Allergies  No family history on file.   Prior to Admission medications   Medication Sig Start Date End Date Taking? Authorizing Provider  amLODipine (NORVASC) 2.5 MG tablet Take 1 tablet (2.5 mg total) by mouth daily. 04/28/15   Costin Otelia Sergeant, MD  levofloxacin (LEVAQUIN) 500 MG tablet Take 1 tablet (500 mg total) by mouth daily. For 4 additional days 04/28/15   Leatha Gilding, MD  pantoprazole (PROTONIX) 20 MG tablet Take 1 tablet (20 mg total) by mouth daily. 04/28/15   Costin Otelia Sergeant, MD   Physical Exam: Filed Vitals:   06/04/15 2115  BP: 118/75  Pulse: 110  Temp:   Resp: 23    BP 118/75 mmHg  Pulse 110  Temp(Src) 100.3 F (37.9 C) (Rectal)  Resp 23  SpO2 98%  General Appearance:    Awake, not oriented,  no distress, appears stated age  Head:    Normocephalic, atraumatic  Eyes:    PERRL, EOMI, sclera non-icteric        Nose:   Nares without drainage or epistaxis. Mucosa, turbinates normal  Throat:   Moist mucous membranes. Oropharynx without erythema or exudate.  Neck:   Supple. No carotid bruits.  No thyromegaly.  No lymphadenopathy.   Back:     No CVA tenderness, no spinal tenderness  Lungs:     Clear to auscultation bilaterally, without wheezes, rhonchi or rales  Chest wall:    No tenderness to palpitation  Heart:    Irr, irr, tachycardic  Abdomen:     Soft, non-tender, nondistended, normal bowel sounds, no organomegaly, Cachectic  Genitalia:    deferred  Rectal:    deferred  Extremities:   No clubbing, cyanosis or edema.  Pulses:   2+ and symmetric all extremities  Skin:   Sacral decubitus present, R hip decub present.  Lymph nodes:   Cervical, supraclavicular, and axillary nodes normal  Neurologic:   CNII-XII intact. Normal strength, sensation and reflexes      throughout    Labs on Admission:  Basic Metabolic Panel:  Recent Labs Lab 06/04/15 2003  NA 135  K 4.1  CL 102  CO2 24  GLUCOSE 112*  BUN 16  CREATININE 0.68  CALCIUM 8.8*  MG 2.0   Liver  Function Tests:  Recent Labs Lab 06/04/15 2003  AST 14*  ALT 8*  ALKPHOS 70  BILITOT 0.6  PROT 7.0  ALBUMIN 2.8*   No results for input(s): LIPASE, AMYLASE in the last 168 hours. No results for input(s): AMMONIA in the last 168 hours. CBC:  Recent Labs Lab 06/04/15 2003  WBC 10.4  NEUTROABS 8.1*  HGB 12.2  HCT 37.7  MCV 87.9  PLT 250   Cardiac Enzymes:  Recent Labs Lab 06/04/15 2003  TROPONINI <0.03    BNP (last 3 results) No results for input(s): PROBNP in the last 8760 hours. CBG: No results for input(s): GLUCAP in the last 168 hours.  Radiological Exams on Admission: Dg Chest Portable 1 View  06/04/2015   CLINICAL DATA:  81 year old with failure to thrive and new onset atrial fibrillation.   EXAM: PORTABLE CHEST 1 VIEW  COMPARISON:  04/25/2015.  FINDINGS: Suboptimal inspiration. Elevation of the right hemidiaphragm, unchanged. Cardiac silhouette mildly to moderately enlarged, unchanged. Lungs clear. Pulmonary vascularity normal. No pneumothorax. No pleural effusions. Generalized osseous demineralization.  IMPRESSION: Stable cardiomegaly.  No acute cardiopulmonary disease.   Electronically Signed   By: Hulan Saas M.D.   On: 06/04/2015 19:36    EKG: Independently reviewed.  Assessment/Plan Active Problems:   Sepsis (HCC)   Atrial fibrillation with RVR (HCC)   Sacral decubitus ulcer   Adult failure to thrive   1. Sepsis - source of tachycardia / fever not entirely clear but presumed to be due to sacral decubitus given absence of other sources 1. ABX 2. IVF 3. BCx 4. Wound care consult 2. A.Fib RVR - 1. Tele monitor 2. Cardizem gtt 3. Adult failure to thrive - 1. Spoke with daughter over phone about this 2. Patient is essentially dying of this 3. Will get dietary consult but doubt they have much to add 4. No feeding tube per daughter 5. Palliative care consult ordered 6. Likely will need SNF or inpatient hospice placement at this point as patient is essentially total care    Code Status: DNR per Daughter, also do not feeding tube Family Communication: Spoke with daughter who is POA over phone Disposition Plan: admit to inpatient   Time spent: 70 min  GARDNER, JARED M. [facility] Pager [phone]  If 7AM-7PM, please contact the day team taking care of the patient [email or web address] Password [email or web address] 06/04/2015, 10:00 PM

## 2015-06-04 NOTE — ED Provider Notes (Signed)
CSN: [id number]     Arrival date & time 06/04/15  1837 History   First MD Initiated Contact with Patient 06/04/15 1851     Chief Complaint  Patient presents with  . Failure To Thrive  . Atrial Fibrillation    Low 5 caveat due to dementia and altered mental status. (Consider location/radiation/quality/duration/timing/severity/associated sxs/prior Treatment) Patient is a 81 y.o. female presenting with atrial fibrillation. The history is provided by the patient.  Atrial Fibrillation   patient was sent in by home nurse for worsening status. Has reportedly been in nursing home but then sent back home when she did not improve. Reportedly has been doing worse over the last few months. Patient has dementia and cannot really provide any history. Found to be in what is presumed to be new onset atrial fibrillation. Does have to dress decubitus ulcers left hip and sacral area.  Past Medical History  Diagnosis Date  . Hypertension   . Dementia    History reviewed. No pertinent past surgical history. No family history on file. Social History  Substance Use Topics  . Smoking status: Never Smoker   . Smokeless tobacco: Never Used  . Alcohol Use: No   OB History    No data available     Review of Systems  Unable to perform ROS     Allergies  Review of patient's allergies indicates no known allergies.  Home Medications   Prior to Admission medications   Medication Sig Start Date End Date Taking? Authorizing Provider  amLODipine (NORVASC) 2.5 MG tablet Take 1 tablet (2.5 mg total) by mouth daily. 04/28/15  Yes Costin Otelia Sergeant, MD   BP 150/91 mmHg  Pulse 86  Temp(Src) 97.7 F (36.5 C) (Oral)  Resp 27  Ht  (1.676 m)  Wt 113 lb 15.7 oz (51.7 kg)  BMI 18.41 kg/m2  SpO2 96% Physical Exam  Constitutional:  Somewhat cachectic  HENT:  Mucous membranes are dry  Eyes: Pupils are equal, round, and reactive to light.  Neck: Neck supple.  Cardiovascular:  Irregular tachycardia   Pulmonary/Chest:  Few scattered rales.  Abdominal: There is no tenderness.  Musculoskeletal: She exhibits no tenderness.  Neurological:  Patient with dementia. Unable to get much history.  Skin:  Dressed decubitus ulcers to left hip and sacral area.    ED Course  Procedures (including critical care time) Labs Review Labs Reviewed  COMPREHENSIVE METABOLIC PANEL - Abnormal; Notable for the following:    Glucose, Bld 112 (*)    Calcium 8.8 (*)    Albumin 2.8 (*)    AST 14 (*)    ALT 8 (*)    All other components within normal limits  URINALYSIS, ROUTINE W REFLEX MICROSCOPIC (NOT AT [facility]) - Abnormal; Notable for the following:    APPearance HAZY (*)    Hgb urine dipstick TRACE (*)    All other components within normal limits  CBC WITH DIFFERENTIAL/PLATELET - Abnormal; Notable for the following:    Neutro Abs 8.1 (*)    All other components within normal limits  URINE MICROSCOPIC-ADD ON - Abnormal; Notable for the following:    Casts HYALINE CASTS (*)    All other components within normal limits  CBC - Abnormal; Notable for the following:    RBC 3.73 (*)    Hemoglobin 10.6 (*)    HCT 33.0 (*)    All other components within normal limits  BASIC METABOLIC PANEL - Abnormal; Notable for the following:    Glucose,  Bld 115 (*)    Calcium 8.4 (*)    All other components within normal limits  CULTURE, BLOOD (ROUTINE X 2)  CULTURE, BLOOD (ROUTINE X 2)  MRSA PCR SCREENING  TROPONIN I  TSH  MAGNESIUM  I-STAT CG4 LACTIC ACID, ED    Imaging Review Dg Chest Portable 1 View  06/04/2015   CLINICAL DATA:  81 year old with failure to thrive and new onset atrial fibrillation.  EXAM: PORTABLE CHEST 1 VIEW  COMPARISON:  04/25/2015.  FINDINGS: Suboptimal inspiration. Elevation of the right hemidiaphragm, unchanged. Cardiac silhouette mildly to moderately enlarged, unchanged. Lungs clear. Pulmonary vascularity normal. No pneumothorax. No pleural effusions. Generalized osseous  demineralization.  IMPRESSION: Stable cardiomegaly.  No acute cardiopulmonary disease.   Electronically Signed   By: Hulan Saas M.D.   On: 06/04/2015 19:36   I have personally reviewed and evaluated these images and lab results as part of my medical decision-making.   EKG Interpretation   Date/Time:  Monday June 04 2015 18:48:25 EDT Ventricular Rate:  131 PR Interval:    QRS Duration: 91 QT Interval:  343 QTC Calculation: 506 R Axis:   79 Text Interpretation:  Atrial fibrillation Repolarization abnormality, prob  rate related Prolonged QT interval Confirmed by Rubin Payor  MD, Harrold Donath  [phone]) on 06/04/2015 7:07:05 PM      MDM   Final diagnoses:  Atrial fibrillation with rapid ventricular response (HCC)  Malnutrition of moderate degree (HCC)  Fever, unspecified fever cause  Pressure ulcer    Patient with A. fib with RVR. New-onset. Also malnutrition and fever. Has an ulcer as the possible source of the fever. Otherwise chest x-ray reassuring. Has severe dementia. Will admit to internal medicine. Has been doing poorly over the last few months.    Benjiman Core, MD 06/06/15 [phone]

## 2015-06-04 NOTE — ED Notes (Signed)
Pt presents via [facility] for failure to thrive.  Pt physically and mentally declining x3-4 months. Pt has not been ambulating, eating, or taking medications per EMS.  Pt went to [facility] for rehab a couple months ago without improvement and was sent back home.  LPN visited pt today and per MD wanted her sent here for possible placement.  Pt lives at home with daughter at this time.  BP 151/88 P-130-170 new onset Afib, R-24-28 CBg-114, pt warm touch, disoriented x 4.  Per EMS pt has 2 bedsores, one to sacrum and one to right hip, dressed by LPN today.  Pt received 500cc NS en route.

## 2015-06-05 DIAGNOSIS — Z515 Encounter for palliative care: Secondary | ICD-10-CM

## 2015-06-05 DIAGNOSIS — Z7189 Other specified counseling: Secondary | ICD-10-CM

## 2015-06-05 LAB — BASIC METABOLIC PANEL
ANION GAP: 7 (ref 5–15)
BUN: 14 mg/dL (ref 6–20)
CHLORIDE: 105 mmol/L (ref 101–111)
CO2: 26 mmol/L (ref 22–32)
Calcium: 8.4 mg/dL — ABNORMAL LOW (ref 8.9–10.3)
Creatinine, Ser: 0.71 mg/dL (ref 0.44–1.00)
GFR calc non Af Amer: >60 mL/min (ref >60)
Glucose, Bld: 115 mg/dL — ABNORMAL HIGH (ref 65–99)
Potassium: 4 mmol/L (ref 3.5–5.1)
Sodium: 138 mmol/L (ref 135–145)

## 2015-06-05 LAB — CBC
HEMATOCRIT: 33 % — AB (ref 36.0–46.0)
HEMOGLOBIN: 10.6 g/dL — AB (ref 12.0–15.0)
MCH: 28.4 pg (ref 26.0–34.0)
MCHC: 32.1 g/dL (ref 30.0–36.0)
MCV: 88.5 fL (ref 78.0–100.0)
Platelets: 218 10*3/uL (ref 150–400)
RBC: 3.73 MIL/uL — ABNORMAL LOW (ref 3.87–5.11)
RDW: 12.9 % (ref 11.5–15.5)
WBC: 9.1 10*3/uL (ref 4.0–10.5)

## 2015-06-05 LAB — MRSA PCR SCREENING: MRSA by PCR: NEGATIVE

## 2015-06-05 MED ORDER — MORPHINE SULFATE (CONCENTRATE) 10 MG/0.5ML PO SOLN
5.0000 mg | ORAL | Status: DC | PRN
  Filled 2015-06-05: qty 0.5

## 2015-06-05 MED ORDER — HALOPERIDOL 0.5 MG PO TABS
0.5000 mg | ORAL_TABLET | ORAL | Status: DC | PRN
  Filled 2015-06-05: qty 1

## 2015-06-05 MED ORDER — HALOPERIDOL LACTATE 2 MG/ML PO CONC
0.5000 mg | ORAL | Status: DC | PRN
  Filled 2015-06-05: qty 0.3

## 2015-06-05 MED ORDER — BIOTENE DRY MOUTH MT LIQD: 15.0000 mL | OROMUCOSAL | Status: DC | PRN

## 2015-06-05 MED ORDER — LORAZEPAM 1 MG PO TABS: 1.0000 mg | ORAL_TABLET | ORAL | Status: DC | PRN

## 2015-06-05 MED ORDER — MORPHINE SULFATE (CONCENTRATE) 10 MG/0.5ML PO SOLN
5.0000 mg | ORAL | Status: DC | PRN
  Administered 2015-06-05: 5 mg via SUBLINGUAL

## 2015-06-05 MED ORDER — GLYCOPYRROLATE 0.2 MG/ML IJ SOLN
0.2000 mg | INTRAMUSCULAR | Status: DC | PRN
  Filled 2015-06-05: qty 1

## 2015-06-05 MED ORDER — HALOPERIDOL LACTATE 5 MG/ML IJ SOLN: 0.5000 mg | INTRAMUSCULAR | Status: DC | PRN

## 2015-06-05 MED ORDER — GLYCOPYRROLATE 1 MG PO TABS
1.0000 mg | ORAL_TABLET | ORAL | Status: DC | PRN
  Filled 2015-06-05: qty 1

## 2015-06-05 MED ORDER — DILTIAZEM HCL 100 MG IV SOLR
5.0000 mg/h | INTRAVENOUS | Status: DC
  Administered 2015-06-05: 5 mg/h via INTRAVENOUS
  Filled 2015-06-05: qty 100

## 2015-06-05 MED ORDER — ACETAMINOPHEN 650 MG RE SUPP: 650.0000 mg | RECTAL | Status: DC | PRN

## 2015-06-05 MED ORDER — POLYVINYL ALCOHOL 1.4 % OP SOLN
1.0000 [drp] | Freq: Four times a day (QID) | OPHTHALMIC | Status: DC | PRN
  Filled 2015-06-05: qty 15

## 2015-06-05 MED ORDER — ONDANSETRON HCL 4 MG/2ML IJ SOLN: 4.0000 mg | Freq: Four times a day (QID) | INTRAMUSCULAR | Status: DC | PRN

## 2015-06-05 MED ORDER — LORAZEPAM 2 MG/ML IJ SOLN: 1.0000 mg | INTRAMUSCULAR | Status: DC | PRN

## 2015-06-05 MED ORDER — LORAZEPAM 2 MG/ML PO CONC: 1.0000 mg | ORAL | Status: DC | PRN

## 2015-06-05 MED ORDER — ONDANSETRON 4 MG PO TBDP
4.0000 mg | ORAL_TABLET | Freq: Four times a day (QID) | ORAL | Status: DC | PRN
  Filled 2015-06-05: qty 1

## 2015-06-05 MED ORDER — ACETAMINOPHEN 325 MG PO TABS: 650.0000 mg | ORAL_TABLET | ORAL | Status: DC | PRN

## 2015-06-05 NOTE — Progress Notes (Signed)
[facility] PROGRESS NOTE   Belinda Richardson WUJ:[id number] DOB: 1920-11-02 DOA: 06/04/2015 PCP: No PCP Per Patient  HPI/Subjective: Only oriented to self. Talk to Dr. Neale Burly of the palliative and hospice medicine about her. Family wanted to pursue hospice route.  Assessment/Plan: Active Problems:   Sepsis (HCC)   Atrial fibrillation with RVR (HCC)   Sacral decubitus ulcer   Adult failure to thrive   1. Sepsis - source of tachycardia / fever not entirely clear but presumed to be due to sacral decubitus given absence of other sources 2. A.Fib RVR  3. Adult failure to thrive 4. Advanced age and advanced dementia  Palliative and hospice medicine team consulted, discussion with family initiated and apparently family wants to pursue the hospice route. Per Dr. Neale Burly, discharged to hospice home when bed available.  Code Status: DNR Family Communication: Plan discussed with the patient. Disposition Plan: Remains inpatient Diet:    Consultants:  Hospice/palliative medicine  Procedures:  None  Antibiotics:  Discontinued   Objective: Filed Vitals:   06/05/15 0800  BP: 97/62  Pulse: 76  Temp: 97.7 F (36.5 C)  Resp: 15    Intake/Output Summary (Last 24 hours) at 06/05/15 1117 Last data filed at 06/05/15 0600  Gross per 24 hour  Intake 1938.25 ml  Output      0 ml  Net 1938.25 ml   Filed Weights   06/04/15 2350  Weight: 51.7 kg (113 lb 15.7 oz)    Exam: General: Alert and awake, oriented x3, not in any acute distress. HEENT: anicteric sclera, pupils reactive to light and accommodation, EOMI CVS: S1-S2 clear, no murmur rubs or gallops Chest: clear to auscultation bilaterally, no wheezing, rales or rhonchi Abdomen: soft nontender, nondistended, normal bowel sounds, no organomegaly Extremities: no cyanosis, clubbing or edema noted bilaterally Neuro: Cranial nerves II-XII intact, no focal neurological deficits  Data Reviewed: Basic Metabolic  Panel:  Recent Labs Lab 06/04/15 2003 06/05/15 0325  NA 135 138  K 4.1 4.0  CL 102 105  CO2 24 26  GLUCOSE 112* 115*  BUN 16 14  CREATININE 0.68 0.71  CALCIUM 8.8* 8.4*  MG 2.0  --    Liver Function Tests:  Recent Labs Lab 06/04/15 2003  AST 14*  ALT 8*  ALKPHOS 70  BILITOT 0.6  PROT 7.0  ALBUMIN 2.8*   No results for input(s): LIPASE, AMYLASE in the last 168 hours. No results for input(s): AMMONIA in the last 168 hours. CBC:  Recent Labs Lab 06/04/15 2003 06/05/15 0325  WBC 10.4 9.1  NEUTROABS 8.1*  --   HGB 12.2 10.6*  HCT 37.7 33.0*  MCV 87.9 88.5  PLT 250 218   Cardiac Enzymes:  Recent Labs Lab 06/04/15 2003  TROPONINI <0.03   BNP (last 3 results) No results for input(s): BNP in the last 8760 hours.  ProBNP (last 3 results) No results for input(s): PROBNP in the last 8760 hours.  CBG: No results for input(s): GLUCAP in the last 168 hours.  Micro Recent Results (from the past 240 hour(s))  MRSA PCR Screening     Status: None   Collection Time: 06/05/15  2:19 AM  Result Value Ref Range Status   MRSA by PCR NEGATIVE NEGATIVE Final    Comment:        The GeneXpert MRSA Assay (FDA approved for NASAL specimens only), is one component of a comprehensive MRSA colonization surveillance program. It is not intended to diagnose MRSA infection nor to guide or monitor treatment  for MRSA infections.      Studies: Dg Chest Portable 1 View  06/04/2015   CLINICAL DATA:  81 year old with failure to thrive and new onset atrial fibrillation.  EXAM: PORTABLE CHEST 1 VIEW  COMPARISON:  04/25/2015.  FINDINGS: Suboptimal inspiration. Elevation of the right hemidiaphragm, unchanged. Cardiac silhouette mildly to moderately enlarged, unchanged. Lungs clear. Pulmonary vascularity normal. No pneumothorax. No pleural effusions. Generalized osseous demineralization.  IMPRESSION: Stable cardiomegaly.  No acute cardiopulmonary disease.   Electronically Signed   By:  Hulan Saas M.D.   On: 06/04/2015 19:36    Scheduled Meds:  Continuous Infusions:      Time spent: 35 minutes    [facility]  [facility] Pager [phone] If 7PM-7AM, please contact night-coverage at [email or web address], password [facility] 06/05/2015, 11:17 AM  LOS: 1 day

## 2015-06-05 NOTE — Progress Notes (Signed)
Initial Nutrition Assessment  DOCUMENTATION CODES:   Severe malnutrition in context of chronic illness, Underweight  INTERVENTION:    Comfort feedings as tolerated per Palliative Care team.  NUTRITION DIAGNOSIS:   Malnutrition related to chronic illness as evidenced by severe depletion of muscle mass, severe depletion of body fat.  GOAL:   Other (Comment) (Comfort feedings per Palliative Care physician)   REASON FOR ASSESSMENT:   Consult Poor PO  ASSESSMENT:   81 y.o. female with end stage dementia, adult failure to thrive with no PO intake including eating or taking any meds. Symptoms ongoing and worsening for past 3-4 months with worsening physical and mental decline. Patient now has sacral decubitus ulcers. Failed rehab at [facility] a couple of months ago. LPN visited patient at home today (lives at home with daughter) and wanted patient sent here for possible placement.  Nutrition-Focused physical exam completed. Findings are severe fat depletion, severe muscle depletion, and mild edema. Patient with severe PCM. Palliative Care team is following. Per discussion with physician, likely will transition to comfort care with comfort feedings as tolerated. No nutrition intervention indicated at this time.  Diet Order:   NPO  Skin:  Wound (see comment) (stage III to sacrum; stage II to hip; knee laceration)  Last BM:  unknown  Height:   Ht Readings from Last 1 Encounters:  06/04/15  (1.676 m)    Weight:   Wt Readings from Last 1 Encounters:  06/04/15 113 lb 15.7 oz (51.7 kg)    Ideal Body Weight:  59.1 kg  BMI:  Body mass index is 18.41 kg/(m^2).  EDUCATION NEEDS:   No education needs identified at this time   Joaquin Courts, RD, LDN, CNSC Pager [phone] After Hours Pager [phone]

## 2015-06-05 NOTE — Consult Note (Addendum)
WOC wound consult note Reason for Consult: Consult requested for sacrum and right hip wounds. Pt is emaciated and frequently incontinent of urine with other systemic factors which can impair healing of skin breakdown areas. Wound type: Sacrum with healing stage 3 pressure injury; 3X1.5X.2cm, red and moist woundbed, no odor, small amt yellow drainage.   Stage 2 pressure injury located next to this site, to right buttock; 3X.5X.1cm, pink and moist woundbed, no odor, small amt yellow drainage.   Right hip with Stage 2 pressure injury .5X.5X.1cm, pink and moist woundbed, no odor, small amt yellow drainage. Pressure Ulcer POA: Yes/No Dressing procedure/placement/frequency: Difficult to maintain a clean dry dressing to sacrum and buttocks related to frequent incontinence.  Foam dressing to protect all sites from further injury. No family present to discuss plan of care. Please re-consult if further assistance is needed.  Thank-you,  Cammie Mcgee MSN, RN, CWOCN, [place], CNS [phone]

## 2015-06-05 NOTE — Consult Note (Signed)
Consultation Note Date: 06/05/2015   Patient Name: Belinda Richardson  DOB: 1921/02/18  MRN: [id number]  Age / Sex: 81 y.o., female   PCP: No Pcp Per Patient Referring Physician: Verlee Monte, MD  Reason for Consultation: Establishing goals of care and Terminal care  Palliative Care Assessment and Plan Summary of Established Goals of Care and Medical Treatment Preferences   Clinical Assessment/Narrative: Belinda Richardson is a 81 year old female with past medical history of end stage dementia who has been living with her daughter.  She presented after LPN visited their home and recommended that she be brought to the hospital.  Workup in ED revealed A.Fib RVR and meeting sepsis criteria with suspected source of infection being decubitus ulcers. UA and CXR negative. Palliative consulted for goals of care moving forward.  I met with Belinda Richardson today. She was lying in bed and sleeping. She did arouse but was unable to fully participate in conversation. She would answer some questions with one-word answers but not appropriately. For example, when asked where she lived with she said, "Mom."  She did say no to all questions on review of systems including denying pain or shortness of breath.  I called and spoke with her daughter, Belinda Richardson.  When asked what is most important to her mother, she reports her family. Her family consists of Belinda Richardson as well as a granddaughter who lives in [place] and 2 great-grandchildren.   Her daughter reports she has been doing her best to care for her mother for the last 6 years, but she knows she has not been able to give her level of care that she needs. This is due to her own physical limitations.  She knows that her mother is reaching the end of her life, and she can no longer care for her at home. She states that at this point she thinks that her mother's wishes would be to be as comfortable as possible. Her main concern is ensuring that wherever her mother goes is somewhere  where she can be well cared for and focus on symptom management.  She tells me that her mother was in a rehabilitation facility for a couple months and was very unhappy there. She then became tearful and said that she just wants the end of her mother's life to be pain-free and someplace or they will care for her. She reports that she feels like she has not had any good options to care for her mother. They have a very limited income and she is concerned this will play a role in where her mother can go for end-of-life care.  We next talked about options moving forward to focus on stated goals of her mother's comfort and dignity at the end-of-life.  She was very receptive to discussion of a plan to focus on comfort care with transition to residential hospice facility for end-of-life care. She has reported to me that her mother has had decreased nutrition for the past several months, but this took an acute change over the weekend and she has had nothing to eat or drink other than part of a milkshake. This has not improved during this hospitalization. Additionally, she is septic likely due to decubitus ulcers.  I told her that based upon these factors, her mother most likely has less than 2 weeks to live. She would therefore likely be a candidate for residential hospice facility for end-of-life care. Her daughter remains hopeful that her mother can be transitioned to residential hospice facility as she states this  would be exactly what her mother would want at this stage in her life.   - I talked to patient's daughter and she is certain that her mother would want to focus on comfort care at this stage. She is hopeful we can obtain placement at residential hospice facility. - Based upon her clinical course, I would suspect her life to be less than 2 weeks now that there is a focus on comfort care only. - I spoke with Dr. Hartford Poli who is in agreement with pursuing placement at residential hospice and transitioning  patient to comfort care here at the hospital. - Consult placed to social work for referral to [facility].  Contacts/Participants in Discussion: Primary Decision Maker: Patient's daughter, Belinda Richardson HCPOA: None on chart, but daughter reports having paperwork at her home.  Code Status/Advance Care Planning:  DNR  Symptom Management:  Patient Denies complaints today.  Will plan for medications as needed for comfort per comfort care order set.  Psycho-social/Spiritual:   Support System: Family  Desire for further Chaplaincy support:yes  Prognosis: < 2 weeks. The patient has not been taking parenteral nutrition. This is an acute change this weekend. Her daughter reports having less than one milkshake the entire weekend for all of her nutrition and hydration. This is not improved here in the hospital. She has been transitioned to comfort measures only after presenting septic likely due to decubitus ulcer and in A. fib with RVR.  Based upon this, I would suspect her course to be a matter of days to weeks.   Discharge Planning:  Hospice facility       Chief Complaint/History of Present Illness:  81 year old female with dementia who presented from home septic likely secondary to decubitus ulcer and hip wounds  Primary Diagnoses  Present on Admission:  . Sepsis ([place]) . Atrial fibrillation with RVR ([place]) . Sacral decubitus ulcer . Adult failure to thrive  Palliative Review of Systems: Unable to obtain due to mental status I have reviewed the medical record, interviewed the patient and family, and examined the patient. The following aspects are pertinent.  Past Medical History  Diagnosis Date  . Hypertension   . Dementia    Social History   Social History  . Marital Status: Widowed    Spouse Name: N/A  . Number of Children: N/A  . Years of Education: N/A   Social History Main Topics  . Smoking status: Never Smoker   . Smokeless tobacco: Never Used  . Alcohol Use:  No  . Drug Use: None  . Sexual Activity: Not Asked   Other Topics Concern  . None   Social History Narrative   No family history on file. Scheduled Meds:  Continuous Infusions:  PRN Meds:.acetaminophen **OR** acetaminophen, antiseptic oral rinse, glycopyrrolate **OR** glycopyrrolate **OR** glycopyrrolate, haloperidol **OR** haloperidol **OR** haloperidol lactate, LORazepam **OR** LORazepam **OR** LORazepam, morphine CONCENTRATE **OR** morphine CONCENTRATE, ondansetron **OR** ondansetron (ZOFRAN) IV, polyvinyl alcohol Medications Prior to Admission:  Prior to Admission medications   Medication Sig Start Date End Date Taking? Authorizing Provider  amLODipine (NORVASC) 2.5 MG tablet Take 1 tablet (2.5 mg total) by mouth daily. 04/28/15  Yes Caren Griffins, MD   No Known Allergies CBC:    Component Value Date/Time   WBC 9.1 06/05/2015 0325   HGB 10.6* 06/05/2015 0325   HCT 33.0* 06/05/2015 0325   PLT 218 06/05/2015 0325   MCV 88.5 06/05/2015 0325   NEUTROABS 8.1* 06/04/2015 2003   LYMPHSABS 1.5 06/04/2015 2003  MONOABS 0.7 06/04/2015 2003   EOSABS 0.1 06/04/2015 2003   BASOSABS 0.0 06/04/2015 2003   Comprehensive Metabolic Panel:    Component Value Date/Time   NA 138 06/05/2015 0325   K 4.0 06/05/2015 0325   CL 105 06/05/2015 0325   CO2 26 06/05/2015 0325   BUN 14 06/05/2015 0325   CREATININE 0.71 06/05/2015 0325   GLUCOSE 115* 06/05/2015 0325   CALCIUM 8.4* 06/05/2015 0325   AST 14* 06/04/2015 2003   ALT 8* 06/04/2015 2003   ALKPHOS 70 06/04/2015 2003   BILITOT 0.6 06/04/2015 2003   PROT 7.0 06/04/2015 2003   ALBUMIN 2.8* 06/04/2015 2003    Physical Exam: Vital Signs: BP 97/62 mmHg  Pulse 76  Temp(Src) 97.7 F (36.5 C) (Oral)  Resp 15  Ht _0  (1.676 m)  Wt 51.7 kg (113 lb 15.7 oz)  BMI 18.41 kg/m2  SpO2 97% SpO2: SpO2: 97 % O2 Device: O2 Device: Not Delivered O2 Flow Rate:   Intake/output summary:  Intake/Output Summary (Last 24 hours) at 06/05/15  1208 Last data filed at 06/05/15 0600  Gross per 24 hour  Intake 1938.25 ml  Output      0 ml  Net 1938.25 ml   LBM:   Baseline Weight: Weight: 51.7 kg (113 lb 15.7 oz) Most recent weight: Weight: 51.7 kg (113 lb 15.7 oz)  Exam Findings:  General: Sleeping, but arousable. Denies complaints. Confused and only 1 word answers to questions. Not interactive does not follow commands. Cachectic. No distress  HEENT: No bruits, no goiter Heart: Irregular. No murmur appreciated. Lungs: Poor air movement, clear other than scattered crackles in bases Abdomen: Soft, nontender, nondistended, positive bowel sounds.  Ext: No significant edema Skin: Warm and dry, unable to assess wounds due to position of patient and she cried when tried to move her Neuro: Not fully assess due to mental status          Palliative Performance Scale: 10%               Additional Data Reviewed: Recent Labs     06/04/15  2003  06/05/15  0325  WBC  10.4  9.1  HGB  12.2  10.6*  PLT  250  218  NA  135  138  BUN  16  14  CREATININE  0.68  0.71     Time In: 0935 Time Out: 1055 Time Total: 80  Greater than 50%  of this time was spent counseling and coordinating care related to the above assessment and plan.  Signed by: Micheline Rough, MD  Micheline Rough, MD  06/05/2015, 12:08 PM  Please contact Palliative Medicine Team phone at [phone] for questions and concerns.

## 2015-06-05 NOTE — Clinical Social Work Note (Signed)
Clinical Social Work Assessment  Patient Details  Name: Belinda Richardson MRN: [id number] Date of Birth: 1921-01-26  Date of referral:  06/05/15               Reason for consult:  End of Life/Hospice                Permission sought to share information with:  Family Supports Permission granted to share information::     Name::     Hendley,Doreen  Relationship::  daughter   Housing/Transportation Living arrangements for the past 2 months:  Single Family Home Source of Information:  Adult Children Patient Interpreter Needed:  None Criminal Activity/Legal Involvement Pertinent to Current Situation/Hospitalization:  No - Comment as needed Significant Relationships:  Adult Children Lives with:  Adult Children Do you feel safe going back to the place where you live?  No Need for family participation in patient care:  Yes (Comment)  Care giving concerns: None   Office manager / plan:  CSW spoke with the pt's daughter [name]. CSW introduced self and purpose of the call. CSW and [name] discussed residential hospice. [name] expressed interested in [facility]. CSW spoke with Carley Hammed regarding hospice referral to [facility]. CSW provided the [facility] with contact information for further questions. CSW will continue to follow this pt and assist with discharge as needed.   Employment status:  Retired Health and safety inspector:  Medicare PT Recommendations:  Not assessed at this time Information / Referral to community resources:   [employer] )  Patient/Family's Response to care:  [name] reported that the care in which the pt has received has been positive.   Patient/Family's Understanding of and Emotional Response to Diagnosis, Current Treatment, and Prognosis: [name] acknowledged that the pt has declined. [name] shared that she no longer can care for the pt.   Emotional Assessment Appearance:  Appears stated age Attitude/Demeanor/Rapport:  Unable to Assess Affect (typically  observed):  Unable to Assess Orientation:  Oriented to Self Alcohol / Substance use:  Not Applicable Psych involvement (Current and /or in the community):  No (Comment)  Discharge Needs  Concerns to be addressed:  Denies Needs/Concerns at this time Readmission within the last 30 days:  No Current discharge risk:  None Barriers to Discharge:  No Barriers Identified   Hinton Luellen, LCSW 06/05/2015, 10:51 AM

## 2015-06-05 NOTE — Consult Note (Signed)
[facility] Liaison: Received request from CSW for family interest in [facility]. Chart reviewed and spoke with daughter by phone to confirm interest and answer questions. Her primary concern is the financial piece. She is agreeable to transfer tomorrow. Dr. Kern Reap to assume care per Doreen's request. Plan to meet [facility] tomorrow at 10:00 to complete registration paper work.   Will need discharge summary faxed to [phone].  RN please call report to [phone].  Thank you.  Forrestine Him, LCSW [phone]

## 2015-06-05 NOTE — Plan of Care (Signed)
Problem: Consults Goal: Skin Care Protocol Initiated - if Braden Score 18 or less If consults are not indicated, leave blank or document N/A Outcome: Progressing Wound care team consult ordered, q2 hour turns protective dressings placed

## 2015-06-05 NOTE — Clinical Social Work Note (Signed)
CSW Consult Acknowledged:   CSW received a consult for SNF placement. CSW will allow the Palliative Care team to discuss goals of care prior to discussing a discharge plan.       Zacari Radick, MSW, LCSWA [phone]

## 2015-06-06 NOTE — Progress Notes (Signed)
Belinda Richardson to be D/C'd [facility] per MD order.  Discussed with the patient and all questions fully answered.  VSS, Skin clean, dry and intact without evidence of skin break down, no evidence of skin tears noted. IV catheter discontinued intact. Site without signs and symptoms of complications. Dressing and pressure applied.    D/c education completed with daugther [name] including follow up instructions of pt going to [facility] as indicated by MD - Daugther able to verbalize understanding, all questions fully answered.     Patient escorted via [facility], and D/C to [facility].  [name] 06/06/2015 12:21 PM

## 2015-06-06 NOTE — Discharge Summary (Signed)
Physician Discharge Summary  Belinda Richardson ZOX:[id number] DOB: 22-May-1921 DOA: 06/04/2015  PCP: No PCP Per Patient  Admit date: 06/04/2015 Discharge date: 06/06/2015  Time spent: 45 minutes  Recommendations for Outpatient Follow-up:  Patient will be discharged to [facility], hospice.  Patient may follow up with primary care provider as needed.    Discharge Diagnoses:  Active Problems:   Sepsis (HCC)   Atrial fibrillation with RVR (HCC)   Sacral decubitus ulcer   Adult failure to thrive  Discharge Condition: Stable  Diet recommendation: Palliative/comfort  Filed Weights   06/04/15 2350  Weight: 51.7 kg (113 lb 15.7 oz)    History of present illness:  On 06/04/2015 by Dr. Lyda Perone Belinda Richardson is a 81 y.o. female with end stage dementia, adult failure to thrive with no PO intake including eating or taking any meds. Symptoms ongoing and worsening for past 3-4 months with worsening physical and mental decline. Patient now has sacral decubitus ulcers. Failed rehab at [facility] a couple of months ago. LPN visited patient at home today (lives at home with daughter) and wanted patient sent here for possible placement.  Work up in ED demonstrates A.Fib RVR with rate into the 140s which improved to 120s with IV hydration. Tm 100.9. UA and CXR negative. Does have decubitus ulcers.  Hospital Course:  Palliative care was consulted and spoke with the family. Patient was transitioned to comfort care only. Patient discharged to residential hospice.  Sepsis - source of tachycardia / fever not entirely clear but presumed to be due to sacral decubitus given absence of other sources A.Fib RVR  Adult failure to thrive Advanced age and advanced dementia  Procedures: None  Consultations: Palliative care  Discharge Exam: Filed Vitals:   06/06/15 0746  BP: 142/90  Pulse: 91  Temp: 98.1 F (36.7 C)  Resp: 22     General: Well developed, thin, NAD,  HEENT: NCAT, mucous  membranes moist.  Cardiovascular: S1 S2 auscultated  Respiratory: Clear to auscultation  Abdomen: Soft, nontender, nondistended, + bowel sounds  Extremities: warm dry without cyanosis clubbing or edema  Discharge Instructions      Discharge Instructions    Discharge instructions    Complete by:  As directed   Patient will be discharged to [facility], hospice.  Patient may follow up with primary care provider as needed.            Medication List    STOP taking these medications        amLODipine 2.5 MG tablet  Commonly known as:  NORVASC       No Known Allergies    The results of significant diagnostics from this hospitalization (including imaging, microbiology, ancillary and laboratory) are listed below for reference.    Significant Diagnostic Studies: Dg Chest Portable 1 View  06/04/2015   CLINICAL DATA:  81 year old with failure to thrive and new onset atrial fibrillation.  EXAM: PORTABLE CHEST 1 VIEW  COMPARISON:  04/25/2015.  FINDINGS: Suboptimal inspiration. Elevation of the right hemidiaphragm, unchanged. Cardiac silhouette mildly to moderately enlarged, unchanged. Lungs clear. Pulmonary vascularity normal. No pneumothorax. No pleural effusions. Generalized osseous demineralization.  IMPRESSION: Stable cardiomegaly.  No acute cardiopulmonary disease.   Electronically Signed   By: Hulan Saas M.D.   On: 06/04/2015 19:36    Microbiology: Recent Results (from the past 240 hour(s))  Culture, blood (routine x 2)     Status: None (Preliminary result)   Collection Time: 06/04/15 10:21 PM  Result Value Ref  Range Status   Specimen Description BLOOD LEFT FOREARM  Final   Special Requests BOTTLES DRAWN AEROBIC AND ANAEROBIC 4CC  Final   Culture NO GROWTH < 12 HOURS  Final   Report Status PENDING  Incomplete  Culture, blood (routine x 2)     Status: None (Preliminary result)   Collection Time: 06/04/15 10:29 PM  Result Value Ref Range Status   Specimen  Description BLOOD RIGHT HAND  Final   Special Requests BOTTLES DRAWN AEROBIC AND ANAEROBIC  Final   Culture NO GROWTH < 12 HOURS  Final   Report Status PENDING  Incomplete  MRSA PCR Screening     Status: None   Collection Time: 06/05/15  2:19 AM  Result Value Ref Range Status   MRSA by PCR NEGATIVE NEGATIVE Final    Comment:        The GeneXpert MRSA Assay (FDA approved for NASAL specimens only), is one component of a comprehensive MRSA colonization surveillance program. It is not intended to diagnose MRSA infection nor to guide or monitor treatment for MRSA infections.      Labs: Basic Metabolic Panel:  Recent Labs Lab 06/04/15 2003 06/05/15 0325  NA 135 138  K 4.1 4.0  CL 102 105  CO2 24 26  GLUCOSE 112* 115*  BUN 16 14  CREATININE 0.68 0.71  CALCIUM 8.8* 8.4*  MG 2.0  --    Liver Function Tests:  Recent Labs Lab 06/04/15 2003  AST 14*  ALT 8*  ALKPHOS 70  BILITOT 0.6  PROT 7.0  ALBUMIN 2.8*   No results for input(s): LIPASE, AMYLASE in the last 168 hours. No results for input(s): AMMONIA in the last 168 hours. CBC:  Recent Labs Lab 06/04/15 2003 06/05/15 0325  WBC 10.4 9.1  NEUTROABS 8.1*  --   HGB 12.2 10.6*  HCT 37.7 33.0*  MCV 87.9 88.5  PLT 250 218   Cardiac Enzymes:  Recent Labs Lab 06/04/15 2003  TROPONINI <0.03   BNP: BNP (last 3 results) No results for input(s): BNP in the last 8760 hours.  ProBNP (last 3 results) No results for input(s): PROBNP in the last 8760 hours.  CBG: No results for input(s): GLUCAP in the last 168 hours.     SignedEdsel Petrin  [facility] 06/06/2015, 10:55 AM

## 2015-06-06 NOTE — Care Management Note (Addendum)
Case Management Note  Patient Details  Name: Mikenna Bunkley MRN: [id number] Date of Birth: October 11, 1920  Subjective/Objective:               Admitted with sepsis/A.Fib RVR, history of end stage dementia who has been living with her daughter.    Action/Plan: Palliative following - Comfort care, [facility]  Expected Discharge Date:                  Expected Discharge Plan:  Hospice Medical Facility  In-House Referral:  Clinical Social Work  Discharge planning Services  CM Consult  Post Acute Care Choice:    Choice offered to:     DME Arranged:    DME Agency:     HH Arranged:    HH Agency:     Status of Service:  In process, will continue to follow  Medicare Important Message Given:    Date Medicare IM Given:    Medicare IM give by:    Date Additional Medicare IM Given:    Additional Medicare Important Message give by:     If discussed at Long Length of Stay Meetings, dates discussed:    Additional Comments: Cyndy Freeze (Daughter)  [phone]  Gae Gallop [place], [place] [phone] 06/06/2015, 11:21 AM

## 2015-06-06 NOTE — Clinical Social Work Note (Signed)
CSW informed by Carley Hammed that the pt's daughter [name] is in admitting providing them with the pt's secondary insurance information. CSW left a voice message for [facility] regarding discharge. CSW contact [facility] at [phone] to schedule transport for the pt. CSW faxed the pt's discharge summary. Bedside RN please call report to [phone].   Aniel Hubble, MSW, LCSWA [phone]

## 2015-06-06 NOTE — Plan of Care (Signed)
Problem: Discharge Progression Outcomes Goal: Other Discharge Outcomes/Goals Outcome: Completed/Met Date Met:  06/06/15 Comfort care

## 2015-06-06 NOTE — Progress Notes (Signed)
Report called to stephanie at [facility]

## 2015-06-06 NOTE — Plan of Care (Signed)
Problem: Discharge Progression Outcomes Goal: Tolerating diet Outcome: Not Met (add Reason) Comfort care

## 2015-06-09 LAB — CULTURE, BLOOD (ROUTINE X 2)
CULTURE: NO GROWTH
Culture: NO GROWTH

## 2015-09-21 IMAGING — CT CT HEAD W/O CM
2 series · 16 of 30 positions shown, 19 images · non-contrast
Comparison: None.

CLINICAL DATA: Dementia. Katya at home with daughter. Mental status
change from baseline according to the daughter.

EXAM:
CT HEAD WITHOUT CONTRAST
TECHNIQUE: Contiguous axial images were obtained from the base of the skull
through the vertex without intravenous contrast.

[Series 2: head w/o · axial · non-contrast · 0.45mm/px · z∈[-80,+40]mm · 9 of 32 slices shown, 12 images]
[im 4/32  brain]
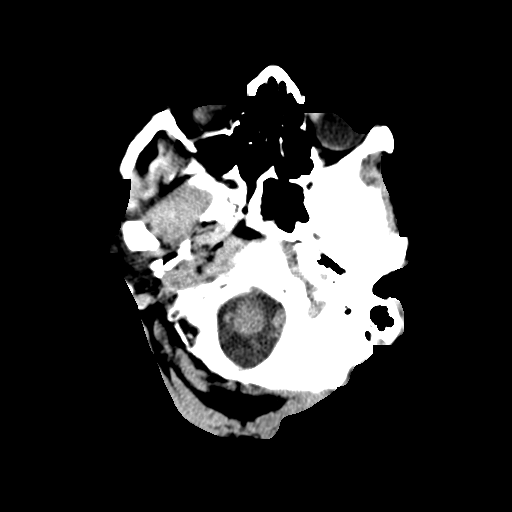
[im 4/32  bone]
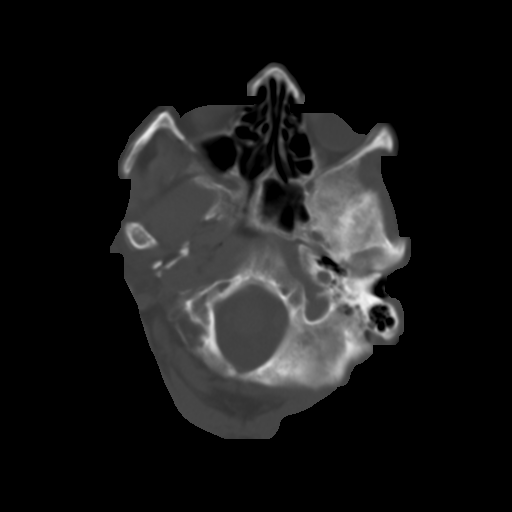
[im 7/32  brain]
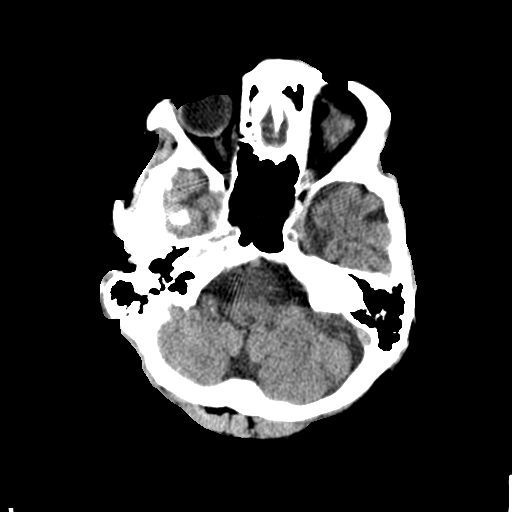
[im 10/32  brain]
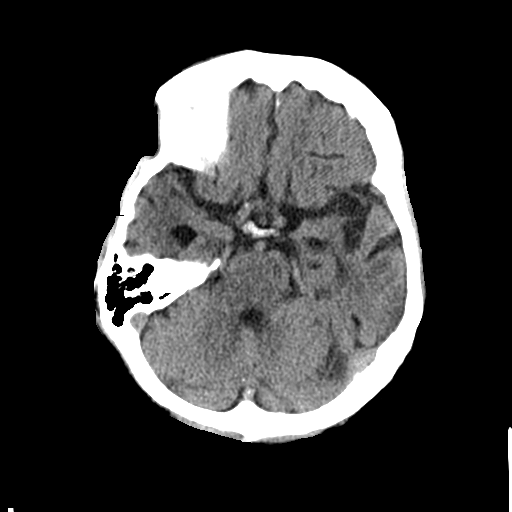
[im 13/32  brain]
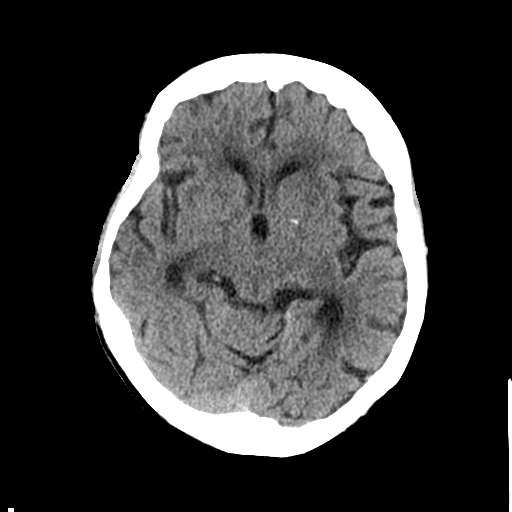
[im 16/32  brain]
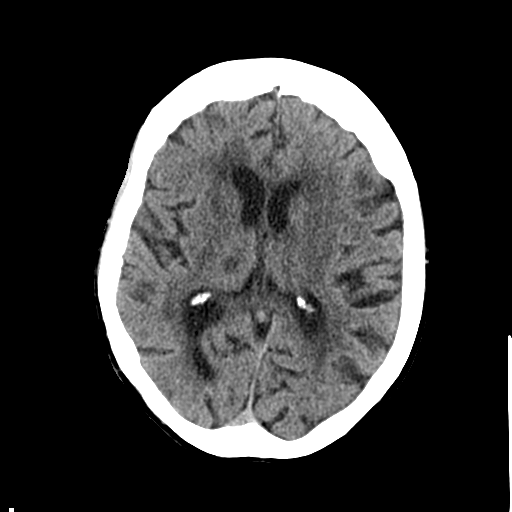
[im 16/32  bone]
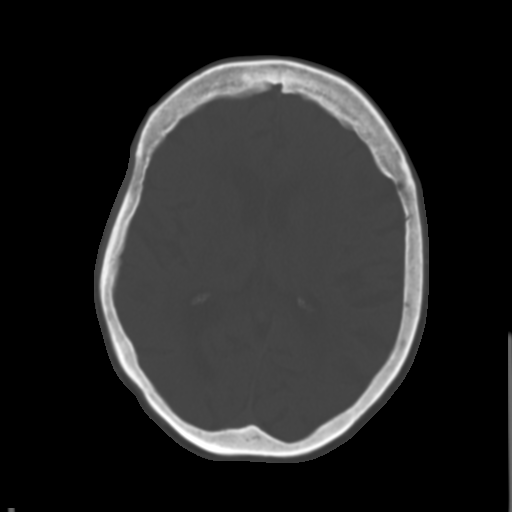
[im 19/32  brain]
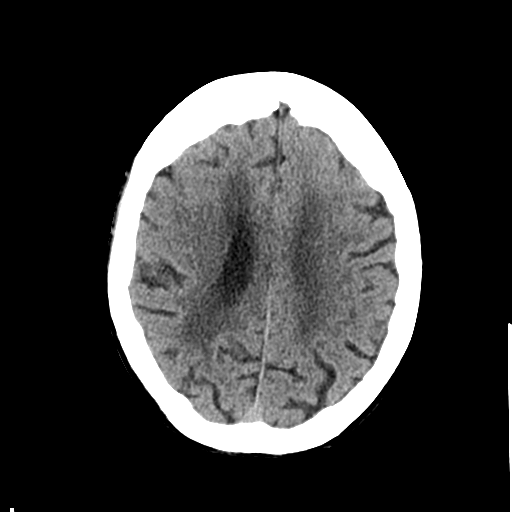
[im 22/32  brain]
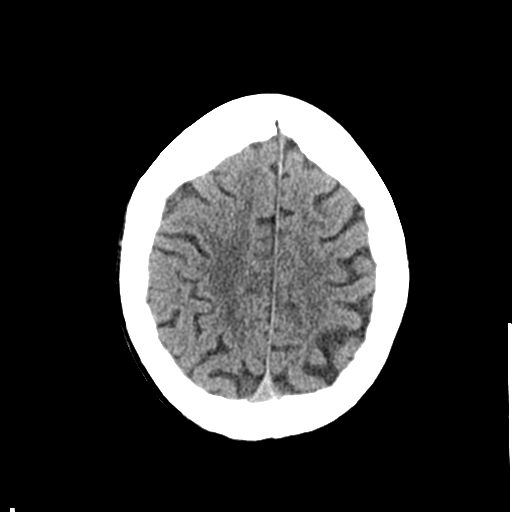
[im 25/32  brain]
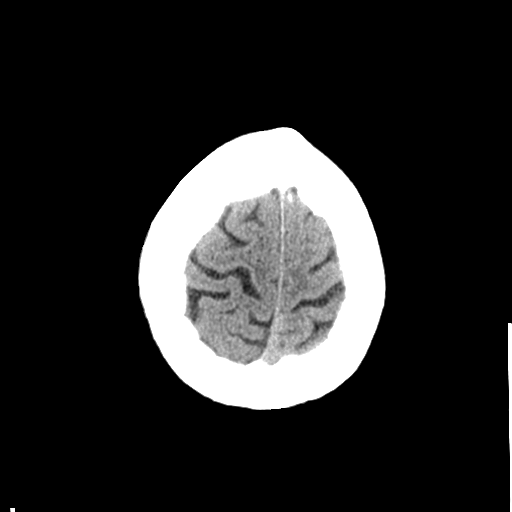
[im 28/32  brain]
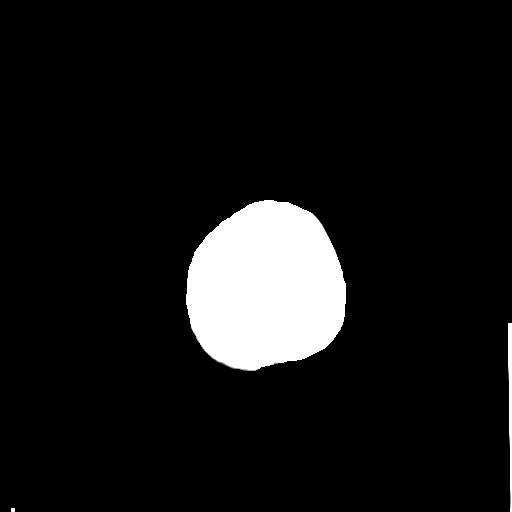
[im 28/32  bone]
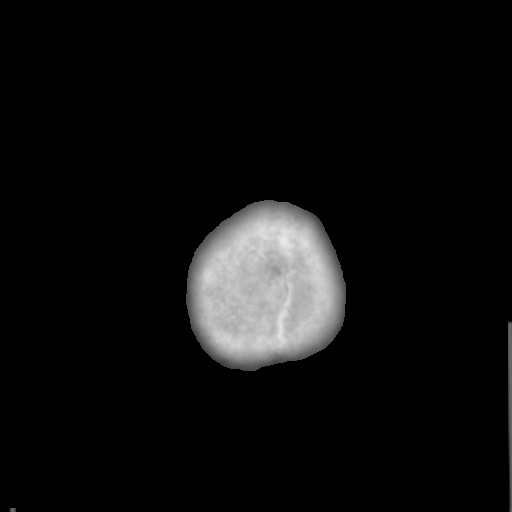

[Series 3: bone windows · axial · 0.45mm/px · z∈[-80,+25]mm · 7 of 53 slices shown]
[im 6/53  bone]
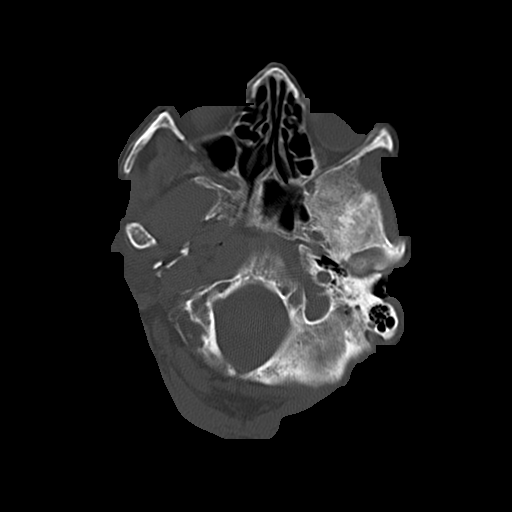
[im 12/53  bone]
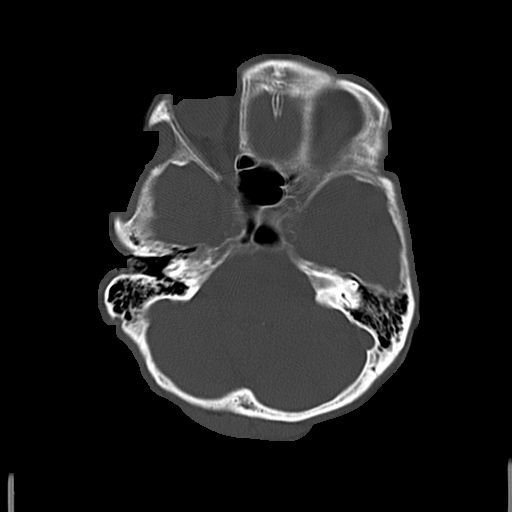
[im 18/53  bone]
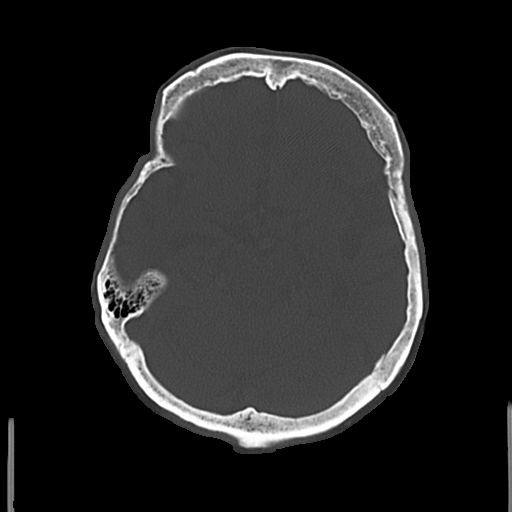
[im 24/53  bone]
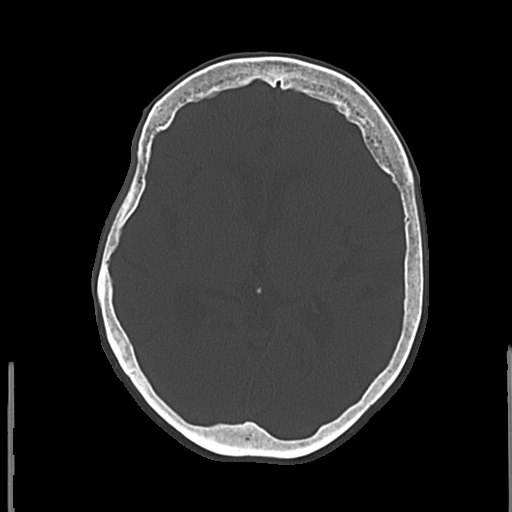
[im 29/53  bone]
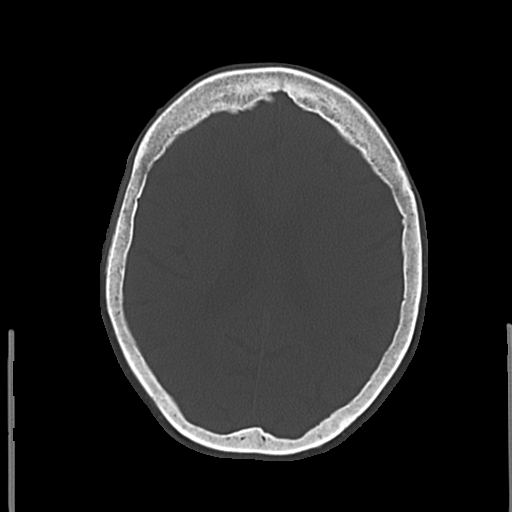
[im 35/53  bone]
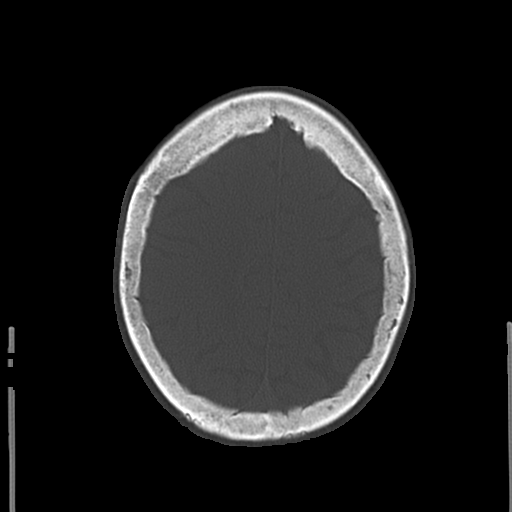
[im 41/53  bone]
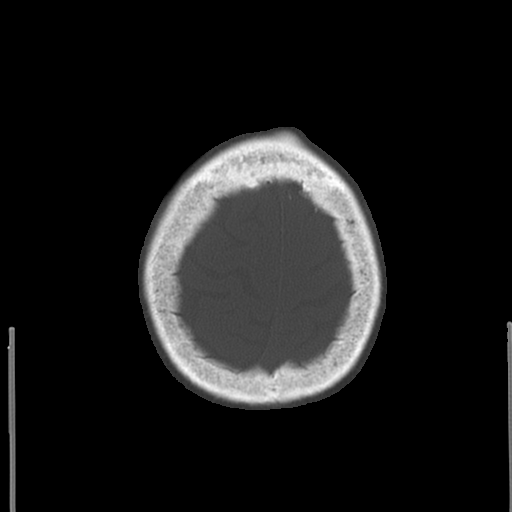

[16 of 30 positions shown; findings below may reference images not displayed]

FINDINGS: There is no evidence of mass effect, midline shift, or extra-axial
fluid collections. There is no evidence of a space-occupying lesion
or intracranial hemorrhage. There is no evidence of a cortical-based
area of acute infarction. There is an age-indeterminate lacunar
infarct in thalamus bilaterally. There is an age-indeterminate right
basal ganglia lacunar infarct. There is generalized cerebral
atrophy. There is periventricular white matter low attenuation
likely secondary to microangiopathy.

The ventricles and sulci are appropriate for the patient's age. The
basal cisterns are patent.

Visualized portions of the orbits are unremarkable. There is cerumen
in the right external auditory canal. The visualized portions of the
paranasal sinuses and mastoid air cells are unremarkable.
Cerebrovascular atherosclerotic calcifications are noted.

The osseous structures are unremarkable.
IMPRESSION: 1. Age-indeterminate lacunar infarct in thalamus bilaterally.
2. Age-indeterminate right basal ganglia lacunar infarct.
3. Chronic microvascular disease and cerebral atrophy.

## 2015-09-21 IMAGING — CR DG CHEST 1V
1 series · 1 of 1 positions shown · non-contrast
Comparison: None.

CLINICAL DATA: Dimension, decreased appetite.

EXAM:
CHEST  1 VIEW

[x chest ap]
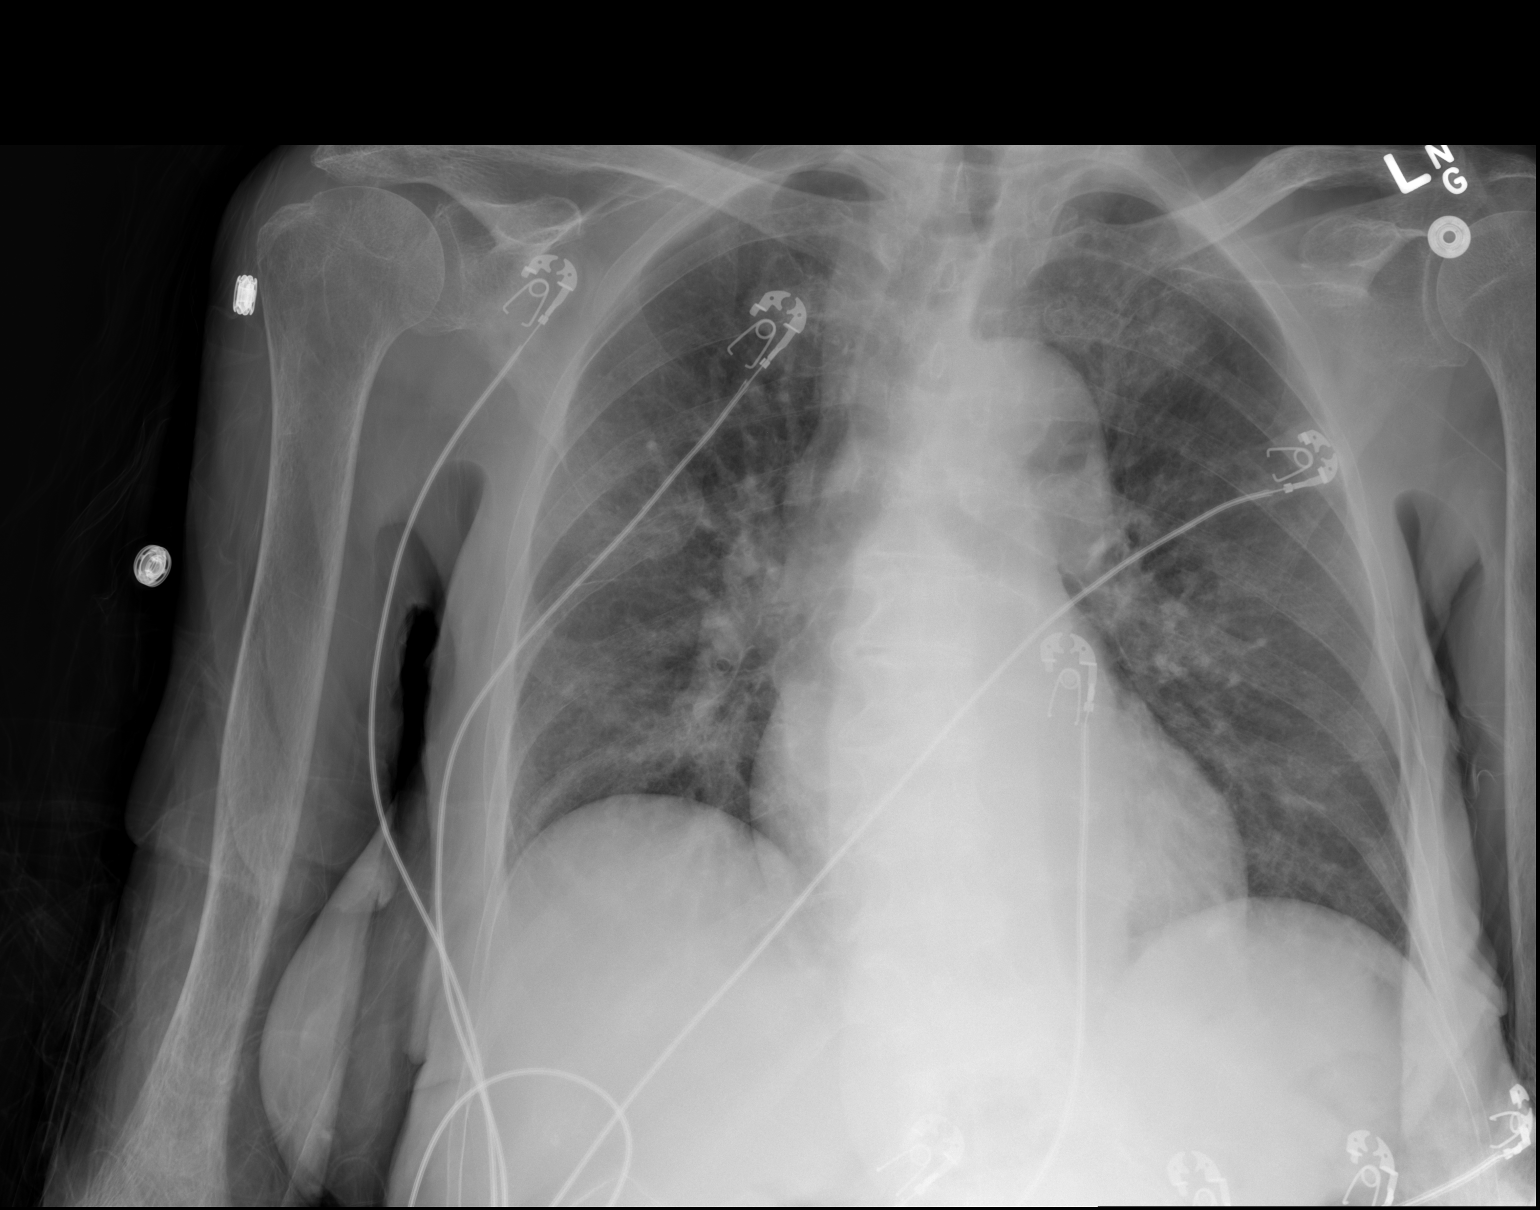

[1 of 1 positions shown; findings below may reference images not displayed]

FINDINGS: Normal cardiac silhouette. There is hazy airspace opacity in the
LEFT upper lobe superior to the aortic large. There is a mild
interstitial pattern in the lower lobes. No pneumothorax.
IMPRESSION: 1. Concern for LEFT upper lobe pneumonia or aspiration pneumonitis.
2. Underlying and mild interstitial edema / venous congestion.

## 2015-10-04 ENCOUNTER — Encounter: Payer: Self-pay | Admitting: Internal Medicine

## 2015-10-04 ENCOUNTER — Non-Acute Institutional Stay (SKILLED_NURSING_FACILITY): Admitting: Internal Medicine

## 2015-10-04 DIAGNOSIS — I10 Essential (primary) hypertension: Secondary | ICD-10-CM

## 2015-10-04 DIAGNOSIS — I4891 Unspecified atrial fibrillation: Secondary | ICD-10-CM | POA: Diagnosis not present

## 2015-10-04 DIAGNOSIS — F0390 Unspecified dementia without behavioral disturbance: Secondary | ICD-10-CM | POA: Diagnosis not present

## 2015-10-04 DIAGNOSIS — R627 Adult failure to thrive: Secondary | ICD-10-CM

## 2015-10-04 DIAGNOSIS — L89159 Pressure ulcer of sacral region, unspecified stage: Secondary | ICD-10-CM

## 2015-10-04 NOTE — Progress Notes (Signed)
MRN: [id number] Name: Belinda Richardson  Sex: female Age: 82 y.o. DOB: 10/11/1920  PSC #: Ronni Rumble Facility/Room:120 Level Of Care: SNF Provider: Merrilee Seashore D Emergency Contacts: Extended Emergency Contact Information Primary Emergency Contact: Hendley,Doreen  [place] Home Phone: [phone] Relation: Daughter  Code Status:   Allergies: Review of patient's allergies indicates no known allergies.  Chief Complaint  Patient presents with  . New Admit To SNF    HPI: Patient is 82 y.o. female with end stage dementia, adult failure to thrive with no PO intake including eating or taking any meds. Symptoms ongoing and worsening for past 6 months with worsening physical and mental decline.Pt cane to SNF on 10/03/2015 for respite with minimal information. Pt was last treated in hospital in 06/2015 where she was septic from sacral decubitus and per family wishes pt was not treated and was d/c to [facility]. Pt apparently graduated from [facility].  Past Medical History  Diagnosis Date  . Hypertension   . Dementia     History reviewed. No pertinent past surgical history.    Medication List    Notice  As of 10/04/2015 11:59 PM   You have not been prescribed any medications.      No orders of the defined types were placed in this encounter.     There is no immunization history on file for this patient.  Social History  Substance Use Topics  . Smoking status: Never Smoker   . Smokeless tobacco: Never Used  . Alcohol Use: No    Family history is  UTO 2/2 pt dementia  Review of Systems  UTO 2/2 pt dementia     Filed Vitals:   10/13/15 1245  BP: 116/76  Pulse: 84  Temp: 97 F (36.1 C)  Resp: 19    SpO2 Readings from Last 1 Encounters:  10/13/15 94%        Physical Exam  GENERAL APPEARANCE: Alert,non  conversant,  No acute distress.  SKIN: No diaphoresis rash HEAD: Normocephalic, atraumatic  EYES: Conjunctiva/lids clear. Pupils  round, reactive. EOMs intact.  EARS: External exam WNL, canals clear. Hearing grossly normal.  NOSE: No deformity or discharge.  MOUTH/THROAT: Lips w/o lesions  RESPIRATORY: Breathing is even, unlabored. Lung sounds are clear   CARDIOVASCULAR: Heart RRR no murmurs, rubs or gallops. No peripheral edema.   GASTROINTESTINAL: Abdomen is soft, non-tender, not distended w/ normal bowel sounds. GENITOURINARY: Bladder non tender, not distended  MUSCULOSKELETAL: wasting NEUROLOGIC:  Cranial nerves 2-12 grossly intact. No abnormal movements  PSYCHIATRIC: dementia, no behavioral issues  Patient Active Problem List   Diagnosis Date Noted  . Atrial fibrillation with RVR (HCC) 06/04/2015  . Sacral decubitus ulcer 06/04/2015  . Adult failure to thrive 06/04/2015  . Pressure ulcer 04/26/2015  . Malnutrition of moderate degree (HCC) 04/26/2015  . Sepsis (HCC) 04/25/2015  . UTI (lower urinary tract infection) 04/25/2015  . CAP (community acquired pneumonia) 04/25/2015  . Weakness 04/25/2015  . Hypertension 04/25/2015  . Dementia 04/25/2015  . Community acquired pneumonia 04/25/2015    CBC    Component Value Date/Time   WBC 9.1 06/05/2015 0325   RBC 3.73* 06/05/2015 0325   HGB 10.6* 06/05/2015 0325   HCT 33.0* 06/05/2015 0325   PLT 218 06/05/2015 0325   MCV 88.5 06/05/2015 0325   LYMPHSABS 1.5 06/04/2015 2003   MONOABS 0.7 06/04/2015 2003   EOSABS 0.1 06/04/2015 2003   BASOSABS 0.0 06/04/2015 2003    CMP     Component  Value Date/Time   NA 138 06/05/2015 0325   K 4.0 06/05/2015 0325   CL 105 06/05/2015 0325   CO2 26 06/05/2015 0325   GLUCOSE 115* 06/05/2015 0325   BUN 14 06/05/2015 0325   CREATININE 0.71 06/05/2015 0325   CALCIUM 8.4* 06/05/2015 0325   PROT 7.0 06/04/2015 2003   ALBUMIN 2.8* 06/04/2015 2003   AST 14* 06/04/2015 2003   ALT 8* 06/04/2015 2003   ALKPHOS 70 06/04/2015 2003   BILITOT 0.6 06/04/2015 2003   GFRNONAA >60 06/05/2015 0325   GFRAA >60 06/05/2015 0325     No results found for: HGBA1C   Dg Chest Portable 1 View  06/04/2015  CLINICAL DATA:  82 year old with failure to thrive and new onset atrial fibrillation. EXAM: PORTABLE CHEST 1 VIEW COMPARISON:  04/25/2015. FINDINGS: Suboptimal inspiration. Elevation of the right hemidiaphragm, unchanged. Cardiac silhouette mildly to moderately enlarged, unchanged. Lungs clear. Pulmonary vascularity normal. No pneumothorax. No pleural effusions. Generalized osseous demineralization. IMPRESSION: Stable cardiomegaly.  No acute cardiopulmonary disease. Electronically Signed   By: Hulan Saas M.D.   On: 06/04/2015 19:36    Not all labs, radiology exams or other studies done during hospitalization come through on my EPIC note; however they are reviewed by me.    Assessment and Plan  Adult failure to thrive Reported not eating or drinking but naturally pt will be offered both and encouraged to partake.  Dementia Cause for FTT; no bahavoirs, supportive care  Hypertension Has hadhx in past, she is now on no meds and BP is controlled  Atrial fibrillation with RVR (HCC) Hx from her last hospitalization; pt is on no meds and her rate is controlled.  Sacral decubitus ulcer Will have wound care to follow while she is here.   Time spent > 10min;> 50% of time with patient was spent reviewing records, labs, tests and studies, counseling and developing plan of care  Margit Hanks, MD

## 2015-10-13 ENCOUNTER — Encounter: Payer: Self-pay | Admitting: Internal Medicine

## 2015-10-13 NOTE — Assessment & Plan Note (Signed)
Will have wound care to follow while she is here.

## 2015-10-13 NOTE — Assessment & Plan Note (Signed)
Cause for FTT; no bahavoirs, supportive care

## 2015-10-13 NOTE — Assessment & Plan Note (Signed)
Has hadhx in past, she is now on no meds and BP is controlled

## 2015-10-13 NOTE — Assessment & Plan Note (Signed)
Reported not eating or drinking but naturally pt will be offered both and encouraged to partake.

## 2015-10-13 NOTE — Assessment & Plan Note (Signed)
Hx from her last hospitalization; pt is on no meds and her rate is controlled.

## 2015-10-31 IMAGING — DX DG CHEST 1V PORT
1 series · 1 of 1 positions shown · non-contrast
Comparison: 04/25/2015.

CLINICAL DATA: [AGE] with failure to thrive and new onset
atrial fibrillation.

EXAM:
PORTABLE CHEST 1 VIEW

[chest ap]
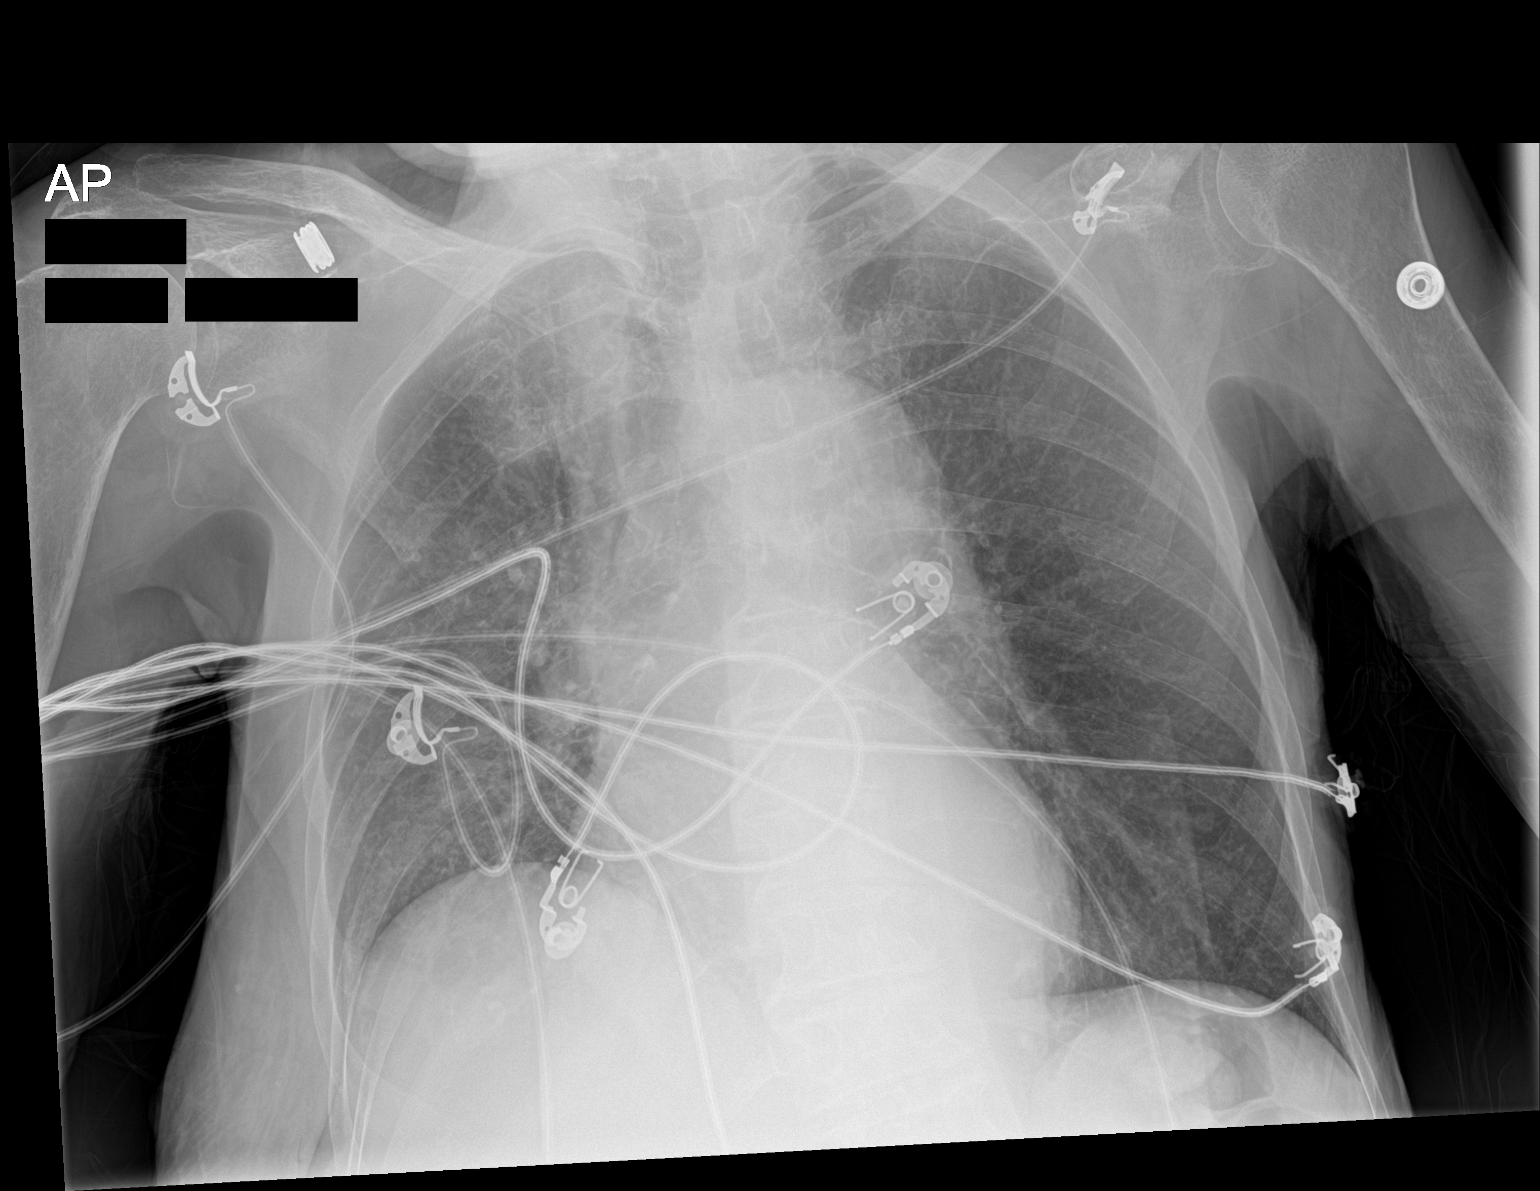

[1 of 1 positions shown; findings below may reference images not displayed]

FINDINGS: Suboptimal inspiration. Elevation of the right hemidiaphragm,
unchanged. Cardiac silhouette mildly to moderately enlarged,
unchanged. Lungs clear. Pulmonary vascularity normal. No
pneumothorax. No pleural effusions. Generalized osseous
demineralization.
IMPRESSION: Stable cardiomegaly.  No acute cardiopulmonary disease.

## 2016-03-01 DEATH — deceased
